# Patient Record
Sex: Male | Born: 1960 | Race: White | Hispanic: No | Marital: Married | Smoking: Never smoker
Health system: Southern US, Community
[De-identification: ages and names within clinical notes are randomized; demographics above are authoritative.]

## PROBLEM LIST (undated history)

## (undated) DIAGNOSIS — Z86018 Personal history of other benign neoplasm: Secondary | ICD-10-CM

## (undated) DIAGNOSIS — L570 Actinic keratosis: Secondary | ICD-10-CM

## (undated) DIAGNOSIS — Z86007 Personal history of in-situ neoplasm of skin: Secondary | ICD-10-CM

## (undated) HISTORY — PX: FINGER TENDON REPAIR: SHX1640

## (undated) HISTORY — PX: CYST EXCISION: SHX5701

## (undated) HISTORY — DX: Personal history of other benign neoplasm: Z86.018

## (undated) HISTORY — PX: OTHER SURGICAL HISTORY: SHX169

## (undated) HISTORY — DX: Personal history of in-situ neoplasm of skin: Z86.007

## (undated) HISTORY — PX: TONSILLECTOMY: SUR1361

## (undated) HISTORY — DX: Actinic keratosis: L57.0

---

## 2003-11-17 ENCOUNTER — Ambulatory Visit: Payer: Self-pay | Admitting: Family Medicine

## 2005-02-25 ENCOUNTER — Ambulatory Visit: Payer: Self-pay | Admitting: Family Medicine

## 2005-05-31 ENCOUNTER — Ambulatory Visit: Payer: Self-pay | Admitting: Family Medicine

## 2006-02-08 ENCOUNTER — Ambulatory Visit: Payer: Self-pay | Admitting: Family Medicine

## 2006-02-15 ENCOUNTER — Ambulatory Visit: Payer: Self-pay | Admitting: Family Medicine

## 2006-02-24 ENCOUNTER — Encounter (INDEPENDENT_AMBULATORY_CARE_PROVIDER_SITE_OTHER): Payer: Self-pay | Admitting: Specialist

## 2006-02-24 ENCOUNTER — Ambulatory Visit: Payer: Self-pay | Admitting: Family Medicine

## 2006-02-24 HISTORY — PX: VASECTOMY: SHX75

## 2006-03-27 ENCOUNTER — Ambulatory Visit: Payer: Self-pay | Admitting: Family Medicine

## 2006-03-30 ENCOUNTER — Ambulatory Visit: Payer: Self-pay | Admitting: Family Medicine

## 2006-05-26 ENCOUNTER — Ambulatory Visit: Payer: Self-pay | Admitting: Family Medicine

## 2006-05-26 LAB — CONVERTED CEMR LAB
ALT: 19 units/L (ref 0–40)
AST: 25 units/L (ref 0–37)
Bilirubin, Direct: 0.1 mg/dL (ref 0.0–0.3)
CO2: 31 meq/L (ref 19–32)
Calcium: 9.4 mg/dL (ref 8.4–10.5)
Chloride: 107 meq/L (ref 96–112)
Cholesterol: 192 mg/dL (ref 0–200)
Glucose, Bld: 89 mg/dL (ref 70–99)
HDL: 48.7 mg/dL (ref >39.0)
Sodium: 143 meq/L (ref 135–145)
TSH: 2.45 microintl units/mL (ref 0.35–5.50)
Total Bilirubin: 1.3 mg/dL — ABNORMAL HIGH (ref 0.3–1.2)
Total Protein: 6.7 g/dL (ref 6.0–8.3)

## 2006-06-02 ENCOUNTER — Ambulatory Visit: Payer: Self-pay | Admitting: Family Medicine

## 2007-02-16 ENCOUNTER — Encounter: Admission: RE | Admit: 2007-02-16 | Discharge: 2007-02-16 | Payer: Self-pay | Admitting: Family Medicine

## 2007-02-16 ENCOUNTER — Ambulatory Visit: Payer: Self-pay | Admitting: Family Medicine

## 2007-02-16 DIAGNOSIS — M79609 Pain in unspecified limb: Secondary | ICD-10-CM

## 2007-02-19 ENCOUNTER — Encounter: Payer: Self-pay | Admitting: Family Medicine

## 2007-02-19 ENCOUNTER — Encounter (INDEPENDENT_AMBULATORY_CARE_PROVIDER_SITE_OTHER): Payer: Self-pay | Admitting: Internal Medicine

## 2007-02-21 ENCOUNTER — Ambulatory Visit (HOSPITAL_BASED_OUTPATIENT_CLINIC_OR_DEPARTMENT_OTHER): Admission: RE | Admit: 2007-02-21 | Discharge: 2007-02-21 | Payer: Self-pay | Admitting: *Deleted

## 2008-09-25 ENCOUNTER — Ambulatory Visit: Payer: Self-pay | Admitting: Family Medicine

## 2008-09-25 DIAGNOSIS — M25529 Pain in unspecified elbow: Secondary | ICD-10-CM

## 2008-09-26 ENCOUNTER — Encounter (INDEPENDENT_AMBULATORY_CARE_PROVIDER_SITE_OTHER): Payer: Self-pay | Admitting: Internal Medicine

## 2009-08-05 ENCOUNTER — Encounter (INDEPENDENT_AMBULATORY_CARE_PROVIDER_SITE_OTHER): Payer: Self-pay | Admitting: *Deleted

## 2010-01-28 ENCOUNTER — Ambulatory Visit
Admission: RE | Admit: 2010-01-28 | Discharge: 2010-01-28 | Payer: Self-pay | Source: Home / Self Care | Attending: Family Medicine | Admitting: Family Medicine

## 2010-01-28 DIAGNOSIS — L723 Sebaceous cyst: Secondary | ICD-10-CM | POA: Insufficient documentation

## 2010-02-02 NOTE — Letter (Signed)
Summary: Nadara Eaton letter  [place] at [facility]  [address] [place], [place] [address]   Phone: [phone]  Fax: [phone]       08/05/2009 MRN: [id number]  Benjamin Simon [address] [place], [place]  [address]  Dear Mr. Onken,  [facility] [place] announce the retirement of Arta Silence, M.D., from full-time practice at the [facility] office effective July 02, 2009 and his plans of returning part-time.  It is important to Dr. Hetty Ely and to our practice that you understand that [facility] has seven physicians in our office for your health care needs.  We will continue to offer the same exceptional care that you have today.    Dr. Hetty Ely has spoken to many of you about his plans for retirement and returning part-time in the fall.   We will continue to work with you through the transition to schedule appointments for you in the office and meet the high standards that [facility] is committed to.   Again, it is with great pleasure that we share the news that Dr. Hetty Ely will return to [facility] in October of 2011 with a reduced schedule.    If you have any questions, or would like to request an appointment with one of our physicians, please call us at [phone] and press the option for Scheduling an appointment.  We take pleasure in providing you with excellent patient care and look forward to seeing you at your next office visit.  Our [facility] Physicians are:  Tillman Abide, M.D. Laurita Quint, M.D. Roxy Manns, M.D. Kerby Nora, M.D. Hannah Beat, M.D. Ruthe Mannan, M.D. We proudly welcomed Raechel Ache, M.D. and Eustaquio Boyden, M.D. to the practice in July/August 2011.  Sincerely,  [facility]

## 2010-02-04 ENCOUNTER — Ambulatory Visit (INDEPENDENT_AMBULATORY_CARE_PROVIDER_SITE_OTHER): Payer: BC Managed Care – PPO | Admitting: Family Medicine

## 2010-02-04 ENCOUNTER — Encounter: Payer: Self-pay | Admitting: Family Medicine

## 2010-02-04 DIAGNOSIS — Z4802 Encounter for removal of sutures: Secondary | ICD-10-CM

## 2010-02-04 NOTE — Assessment & Plan Note (Signed)
Summary: CHECK CYST ON HEAD,?REMOVAL/CLE   Vital Signs:  Patient profile:   50 year old male Weight:      167 pounds BMI:     26.25 Temp:     97.9 degrees F oral Pulse rate:   51 / minute Pulse rhythm:   regular BP sitting:   121 / 72  (left arm) Cuff size:   large  Vitals Entered By: Mervin Hack CMA Duncan Dull) (January 28, 2010 8:57 AM) CC: check cyst on head   History of Present Illness: Pt is here for "lump " on the head, right posterior area of the scalp. He has discomfort occas and wonders if it is getting bigger. Would like it removed.  Problems Prior to Update: 1)  Elbow Pain, Left  (ICD-719.42) 2)  Thumb Pain, Right  (ICD-729.5) 3)  Hypercholesterolemia, Borderline Low Hdl  (ICD-272.4) 4)  Examination, Routine Medical  (ICD-V70.0)  Medications Prior to Update: 1)  Ibuprofen 200 Mg Tabs (Ibuprofen) .... Take 2-3 By Mouth Q 4-6 Hrs As Needed 2)  Nizoral 2 % Sham (Ketoconazole) .... As Needed  Allergies: No Known Drug Allergies  Physical Exam  General:  alert, well-developed, well-nourished, and well-hydrated.   Head:  normocephalic, atraumatic, and no abnormalities observed.   Eyes:  pupils equal, pupils round, and no injection.   Ears:  External ear exam shows no significant lesions or deformities.  Otoscopic examination reveals clear canals, tympanic membranes are intact bilaterally without bulging, retraction, inflammation or discharge. Hearing is grossly normal bilaterally. Nose:  External nasal examination shows no deformity or inflammation. Nasal mucosa are pink and moist without lesions or exudates. Mouth:  Oral mucosa and oropharynx without lesions or exudates.  Teeth in good repair. Lungs:  Normal respiratory effort, chest expands symmetrically. Lungs are clear to auscultation, no crackles or wheezes. Heart:  Normal rate and regular rhythm. S1 and S2 normal without gallop, murmur, click, rub or other extra sounds. Skin:  Right posterior crown of scalp, 3cm  spongy swelling w/o erythema or tenderness. Prepped and draped in usual sterile manner with betadine. 2% lido w/ epi used for local. Incision made over area and entire cystic lesion removed in total until the very end when the sac collapsed and sebum was elicited. The area was cleansed and dried with packing which was removed. Three simple interrupted 4-0 sutures were placed. Bactroban applied along the incision line. Tolerated well.   Impression & Recommendations:  Problem # 1:  SEBACEOUS CYST, SCALP (ICD-706.2) Assessment New Excised. Suture removal in one week. Orders: I&D Abscess, Complex (10061)   Orders Added: 1)  I&D Abscess, Complex [10061]    Prior Medications: Current Allergies (reviewed today): No known allergies

## 2010-02-10 NOTE — Assessment & Plan Note (Signed)
   Vital Signs:  Patient profile:   50 year old male Weight:      167.50 pounds Temp:     97.2 degrees F oral Pulse rate:   60 / minute Pulse rhythm:   regular BP sitting:   110 / 78  (left arm) Cuff size:   regular  Vitals Entered By: Sydell Axon LPN (February 04, 2010 10:16 AM) CC: one week follow-up to remove stitches   History of Present Illness: Pt here for suture removal from sebaceous cyst excision. He has done well with no problems.  Allergies: No Known Drug Allergies  Physical Exam  Skin:  Incision clean and dry. Sutures in place and incision line healing well. Sutures removed. No steri strips placed due to being well healed. Neosporin placed and told to keep clean and dry for two days.   Impression & Recommendations:  Problem # 1:  ENCOUNTER FOR REMOVAL OF SUTURES (ICD-V58.32) Assessment Comment Only  Removed.  Orders: No Charge Patient Arrived (NCPA0) (NCPA0)   Orders Added: 1)  No Charge Patient Arrived (NCPA0) [NCPA0]    Current Allergies (reviewed today): No known allergies

## 2010-05-18 NOTE — Op Note (Signed)
NAME:  Benjamin Simon, Benjamin Simon               ACCOUNT NO.:  [id number]   MEDICAL RECORD NO.:  [id number]          PATIENT TYPE:  AMB   LOCATION:  DSC                          FACILITY:  [facility]   PHYSICIAN:  Matthew A. Weingold, M.D.DATE OF BIRTH:  12/15/1960   DATE OF PROCEDURE:  02/21/2007  DATE OF DISCHARGE:                               OPERATIVE REPORT   PREOPERATIVE DIAGNOSIS:  Acute right thumb ulnar collateral ligament  tear.   POSTOPERATIVE DIAGNOSIS:  Acute right thumb ulnar collateral ligament  tear.   PROCEDURE:  Operative repair of right thumb ulnar collateral ligament.   SURGEON:  Artist Pais. Mina Marble, M.D.   ASSISTANT:  None.   ANESTHESIA:  General.   TOURNIQUET TIME:  41 minutes.   COMPLICATIONS:  None.   DRAINS:  None.   OPERATIVE REPORT:  The patient was taken to the operating suite. After  induction of adequate general anesthesia, the right upper extremity was  prepped and draped in a sterile fashion.  An Esmarch was used to  exsanguinate the limb and the tourniquet was inflated to 250 mL. At this  point in time, an incision was made on the ulnar side of the metacarpal  phalangeal joint of the right thumb and a large dorsal based flap was  elevated.  Once this was done, the abductor aponeurosis was  longitudinally split.  After this was done, a dorsal ulnar capsulotomy  was performed.  Dissection was carried down to the ulnar side of the  metacarpal phalangeal joint.  There was a near complete tear of the  ulnar collateral ligament. Small visible fibers were intact. A small bit  of bone off the base of the proximal phalanx was encountered.  This  small fragment was excised.  The ligament was then carefully dissected  free from the underlying capsule.  It was sutured with a 2-0 double  armed Prolene and then drawn into the osseous defect using two Mellody Dance  needles that were tied over a well padded button on the radial side of  the proximal phalanx.  This completed the  repair. After this was done,  the wound was irrigated.  It was loosely closed in layers with 4-0  Mersilene on the capsule as well as a 4-0 Mersilene locked stitch on the  abductor aponeurosis followed by 4-0 Vicryl Rapide subcuticular on the  skin.  Steri-Strips, 4x4s, fluffs, and radial gutter splint was applied.  The patient tolerated the procedure well and went to the recovery room  in a stable condition.     Artist Pais Mina Marble, M.D.  Electronically Signed    MAW/MEDQ  D:  02/21/2007  T:  02/21/2007  Job:  [id number]

## 2010-05-21 NOTE — Assessment & Plan Note (Signed)
[name]                                 ON-CALL NOTE   Benjamin Simon, Benjamin Simon                        MRN:          [id number]  DATE:03/24/2006                            DOB:          26-Mar-1960    TIME RECEIVED:  2:11 p.m.   CALLER:  Roswell Nickel.   He sees Dr. Hetty Ely.   PHONE NUMBER:  [phone]   The patient says Dr. Hetty Ely performed a vasectomy on him 1 month ago.  He has had intermittent swelling and tenderness on 1 side of his scrotum  ever since.  Last night, he developed substantial swelling, redness, and  quite a bit of pain in the scrotum.  He is concerned because he is  leaving town later [employer] on a trip.  My answer is to go to Urgent Care  today.     Tera Mater. Clent Ridges, MD  Electronically Signed    SAF/MedQ  DD: 03/25/2006  DT: 03/25/2006  Job #: [id number]

## 2011-08-23 ENCOUNTER — Other Ambulatory Visit: Payer: Self-pay | Admitting: Family Medicine

## 2011-08-23 DIAGNOSIS — Z131 Encounter for screening for diabetes mellitus: Secondary | ICD-10-CM

## 2011-08-23 DIAGNOSIS — Z13220 Encounter for screening for lipoid disorders: Secondary | ICD-10-CM

## 2011-08-26 ENCOUNTER — Other Ambulatory Visit (INDEPENDENT_AMBULATORY_CARE_PROVIDER_SITE_OTHER): Payer: BC Managed Care – PPO

## 2011-08-26 DIAGNOSIS — Z131 Encounter for screening for diabetes mellitus: Secondary | ICD-10-CM

## 2011-08-26 DIAGNOSIS — Z13220 Encounter for screening for lipoid disorders: Secondary | ICD-10-CM

## 2011-08-26 LAB — LIPID PANEL
LDL Cholesterol: 111 mg/dL — ABNORMAL HIGH (ref 0–99)
Total CHOL/HDL Ratio: 3

## 2011-08-26 LAB — GLUCOSE, RANDOM: Glucose, Bld: 67 mg/dL — ABNORMAL LOW (ref 70–99)

## 2011-08-29 ENCOUNTER — Encounter: Payer: Self-pay | Admitting: Family Medicine

## 2011-09-02 ENCOUNTER — Ambulatory Visit (INDEPENDENT_AMBULATORY_CARE_PROVIDER_SITE_OTHER): Payer: BC Managed Care – PPO | Admitting: Family Medicine

## 2011-09-02 ENCOUNTER — Encounter: Payer: Self-pay | Admitting: Family Medicine

## 2011-09-02 VITALS — BP 114/84 | HR 61 | Temp 98.1°F | Ht 67.5 in | Wt 159.0 lb

## 2011-09-02 DIAGNOSIS — Z0000 Encounter for general adult medical examination without abnormal findings: Secondary | ICD-10-CM

## 2011-09-02 DIAGNOSIS — Z23 Encounter for immunization: Secondary | ICD-10-CM

## 2011-09-02 NOTE — Assessment & Plan Note (Signed)
Routine anticipatory guidance given to patient.  See health maintenance. Tetanus 2013 Flu encouraged Colon and prostate cancer screening not indicated Sugar and lipids wnl.  Advance directive encouarged.  Wife would be designated if he were incapacitated.

## 2011-09-02 NOTE — Patient Instructions (Addendum)
Take care. Glad to see you.   I would get a flu shot each fall.   I would get another physical in 1-2 years.

## 2011-09-02 NOTE — Progress Notes (Signed)
CPE- See plan.  Routine anticipatory guidance given to patient.  See health maintenance. Tetanus 2013 Flu encouraged Colon and prostate cancer screening not indicated Sugar and lipids wnl.  Advance directive encouarged.  Wife would be designated if he were incapacitated.    PMH and SH reviewed  Meds, vitals, and allergies reviewed.   ROS: See HPI.  Otherwise negative.    GEN: nad, alert and oriented HEENT: mucous membranes moist NECK: supple w/o LA CV: rrr. PULM: ctab, no inc wob ABD: soft, +bs EXT: no edema SKIN: no acute rash

## 2012-03-28 ENCOUNTER — Ambulatory Visit (INDEPENDENT_AMBULATORY_CARE_PROVIDER_SITE_OTHER): Payer: BC Managed Care – PPO | Admitting: Sports Medicine

## 2012-03-28 VITALS — BP 120/80 | Ht 67.0 in | Wt 160.0 lb

## 2012-03-28 DIAGNOSIS — M722 Plantar fascial fibromatosis: Secondary | ICD-10-CM

## 2012-03-28 NOTE — Assessment & Plan Note (Signed)
Patient was fitted with sports insoles and had corrections of small scaphoid pads as well as a small metatarsal pad bilaterally. Patient did have these put in his regular shoe which were comfortable. Patient wa basis. Encourage patient to concentrate on each centric exercises of the posterior capsule. Patient was also given arch straps that he can wear in his cycling shoes. Patient will return again in 6 weeks for further evaluation. At that time we will potentially readdress with an ultrasound.

## 2012-03-28 NOTE — Progress Notes (Signed)
Chief complaint: Right foot pain  History of present illness: The patient is a very pleasant 52 year old male coming in with a one year history of right foot pain. Patient has been diagnosed with plantar fasciitis before. Patient states that the pain seems to be worse in the morning and after sitting for long amount of time. In addition a distal recently he has some custom orthotics but he has noticed that he's been having more and heel pain even with activity. Patient denies any swelling, numbness, or that it is stopping any of his regular activities. Patient is still able to work out and is sleeping comfortably. Patient has used anti-inflammatories seldomly which has helped. Patient has not been doing icing on a regular basis or stretching. Patient describes the pain is more of a sharp stabbing sensation with the first steps that seems to resolve within still has a chronic dull aching sensation most of the time.  No past medical history on file.  Patient Active Problem List  Diagnosis  . ELBOW PAIN, LEFT  . THUMB PAIN, RIGHT  . SEBACEOUS CYST, SCALP  . Routine general medical examination at a health care facility    Past Surgical History  Procedure Laterality Date  . Tonsillectomy    . Fracture of right fibula with fractured ankle    . Laceration rle    . Vasectomy  02/24/2006    Schaller  . Finger tendon repair      R thumb tendon repair    Family History  Problem Relation Age of Onset  . Heart disease Father     CAD  . Colon cancer Neg Hx   . Prostate cancer Neg Hx     History  Substance Use Topics  . Smoking status: Never Smoker   . Smokeless tobacco: Not on file  . Alcohol Use: No   Physical exam Blood pressure 120/80, height 5\' 7"  (1.702 m), weight 160 lb (72.576 kg). General: No apparent distress alert and oriented x3 mood and affect normal Respiratory: Patient's speak in full sentences and does not appear short of breath Skin: Warm dry intact with no signs of  infection or rash Neuro: Cranial nerves II through XII are intact, neurovascularly intact in all extremities with 2+ DTRs and 2+ pulses. Right foot exam: Normal inspection with no visable or palpable fat pad atrophy and no visible swelling/erythema. Patient is tender at medial insertion of plantar fascia into calcaneus. 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 Patient's plantar fasciitis at the insertion at the calcaneus and does have thickening of 0.79 cm on the right side which is his symptomatic side. His left side measures 0.53 cm. 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 This measures approximately 0.4 cm in diameter.

## 2012-05-08 ENCOUNTER — Ambulatory Visit (INDEPENDENT_AMBULATORY_CARE_PROVIDER_SITE_OTHER): Payer: BC Managed Care – PPO | Admitting: Family Medicine

## 2012-05-08 ENCOUNTER — Encounter: Payer: Self-pay | Admitting: Family Medicine

## 2012-05-08 VITALS — BP 125/85 | HR 56 | Ht 67.0 in | Wt 160.0 lb

## 2012-05-08 DIAGNOSIS — M722 Plantar fascial fibromatosis: Secondary | ICD-10-CM

## 2012-05-08 MED ORDER — MELOXICAM 15 MG PO TABS: 15.0000 mg | ORAL_TABLET | Freq: Every day | ORAL | Status: DC

## 2012-05-08 MED ORDER — NITROGLYCERIN 0.2 MG/HR TD PT24: MEDICATED_PATCH | TRANSDERMAL | Status: DC

## 2012-05-08 NOTE — Patient Instructions (Signed)
Good to see you Call your insurance company when you have a chance and ask about coverage of custom orthotics.  If you want come back at anytime and we will make those if it is possible.  In the meantime I will refill the meloxicam.  We will try the nitro patch.  May give you headache and remember to look for rash.  Use 1/4 patch to area daily.  Continue the insoles we gave you.  We will send you to PT and they will call you to make an appointment.  Lets have you come back in 4-6 weeks  Or sooner if you want to do custom orthotics.

## 2012-05-08 NOTE — Progress Notes (Signed)
Chief complaint: Right sided foot pain  History of present illness: The patient is a 52 year old male coming back with right-sided plantar fasciitis. Patient did have a muscular skeletal ultrasound performed previously and did show a 0.79 cm thickening compared to the contralateral side. Patient states that the pain is minimally better than previously. Patient has started taking ibuprofen on its own accord and noticed some improvement. Patient is doing the exercises fairly regularly and tries to ice his foot tightly if he does not forget. Patient unfortunately states that he still has significant painfulness when he starts walking it seems to get worse after sitting at lunchtime. Patient denies any new symptoms.  Past medical history, social, surgical and family history all reviewed.   Physical exam Blood pressure 125/85, pulse 56, height 5\' 7"  (1.702 m), weight 160 lb (72.576 kg). General: No apparent distress alert and oriented x3 mood and affect normal Respiratory: Patient's speak in full sentences and does not appear short of breath Skin: Warm dry intact with no signs of infection or rash Neuro: Cranial nerves II through XII are intact, neurovascularly intact in all extremities with 2+ DTRs and 2+ pulses. Excision no deformity. Patient is still very tender to palpation of the medial insertion of the plantar fascia into the calcaneus. Great toe motion is normal. 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  Musculoskeletal ultrasound was performed and interpreted by me today. Patient's right plantar fascia does not show any significant hypoechoic changes and has a measurement of 0.59 cm which is significantly better than previous exam. There is some neovascularization on the superficial aspect. There is no bone spur appreciated. Patient small tear appears to have more healing and scar tissue formation in the area.

## 2012-05-08 NOTE — Assessment & Plan Note (Signed)
Ultrasound at showed patient's plantar fasciitis his decrease in size tremendously. Patient is back within the normal range. Patient's will try and nitroglycerin patch no secondary to the neovascularization in the area. We'll see if this will help some of his pain resolved. Patient will continue with the sports insoles but will check with his insurance company. If he is able to get custom orthotics he will call and have this scheduled. Patient will also start formal physical therapy for the plantar fasciitis with diaphoresis to see if this will help. Patient was given a prescription for meloxicam. Patient will try these interventions and then come back again in 4-6 weeks to make sure he continues to improve.

## 2012-05-23 ENCOUNTER — Ambulatory Visit: Payer: BC Managed Care – PPO | Attending: Sports Medicine | Admitting: Physical Therapy

## 2012-05-23 DIAGNOSIS — M25579 Pain in unspecified ankle and joints of unspecified foot: Secondary | ICD-10-CM | POA: Insufficient documentation

## 2012-05-23 DIAGNOSIS — R269 Unspecified abnormalities of gait and mobility: Secondary | ICD-10-CM | POA: Insufficient documentation

## 2012-05-23 DIAGNOSIS — M25559 Pain in unspecified hip: Secondary | ICD-10-CM | POA: Insufficient documentation

## 2012-05-23 DIAGNOSIS — IMO0001 Reserved for inherently not codable concepts without codable children: Secondary | ICD-10-CM | POA: Insufficient documentation

## 2012-06-04 ENCOUNTER — Ambulatory Visit: Payer: BC Managed Care – PPO | Attending: Sports Medicine | Admitting: Physical Therapy

## 2012-06-04 DIAGNOSIS — R269 Unspecified abnormalities of gait and mobility: Secondary | ICD-10-CM | POA: Insufficient documentation

## 2012-06-04 DIAGNOSIS — M25579 Pain in unspecified ankle and joints of unspecified foot: Secondary | ICD-10-CM | POA: Insufficient documentation

## 2012-06-04 DIAGNOSIS — IMO0001 Reserved for inherently not codable concepts without codable children: Secondary | ICD-10-CM | POA: Insufficient documentation

## 2012-06-04 DIAGNOSIS — M25559 Pain in unspecified hip: Secondary | ICD-10-CM | POA: Insufficient documentation

## 2012-06-11 ENCOUNTER — Ambulatory Visit: Payer: BC Managed Care – PPO | Admitting: Physical Therapy

## 2012-06-12 ENCOUNTER — Ambulatory Visit: Payer: BC Managed Care – PPO | Admitting: Family Medicine

## 2012-06-19 ENCOUNTER — Ambulatory Visit: Payer: BC Managed Care – PPO | Admitting: Physical Therapy

## 2012-06-19 ENCOUNTER — Ambulatory Visit (INDEPENDENT_AMBULATORY_CARE_PROVIDER_SITE_OTHER): Payer: BC Managed Care – PPO | Admitting: Family Medicine

## 2012-06-19 VITALS — BP 135/86 | Ht 67.0 in | Wt 160.0 lb

## 2012-06-19 DIAGNOSIS — M722 Plantar fascial fibromatosis: Secondary | ICD-10-CM

## 2012-06-19 NOTE — Progress Notes (Signed)
Patient is here for followup of bilateral plantar fasciitis. Patient has been going to physical therapy, and nitroglycerin patches, and sports insoles. Patient notices approximately 30-40% improvement. Patient states that throughout the day he seems to be doing much better but still has significant tightness in the morning when he wakes up right greater than left. Patient denies any new symptoms such as numbness or weakness. Patient also denies any fevers chills or any abnormal weight loss. Patient is here also for custom orthotics.  Physical Blood pressure 135/86, height 5\' 7"  (1.702 m), weight 160 lb (72.576 kg). Foot exam Normal inspection with no visable or palpable fat pad atrophy and no visible swelling/erythema. Patient is tender at medial insertion of plantar fascia into calcaneus. Great toe motion:normal Arch shape: Preterm transverse arch with splaying between the first and second toe Other foot breakdown: Patient does have mild overpronation of the hindfoot with ambulation.    Patient was fitted for a : standard, cushioned, semi-rigid orthotic. The orthotic was heated and afterward the patient stood on the orthotic blank positioned on the orthotic stand. The patient was positioned in subtalar neutral position and 10 degrees of ankle dorsiflexion in a weight bearing stance. After completion of molding, a stable base was applied to the orthotic blank. The blank was ground to a stable position for weight bearing. Size:9 Base: [facility] and Padding: Metatarsal pads bilaterally The patient ambulated these, and they were very comfortable.  I spent 45 minutes with this patient, greater than 50% was face-to-face time counseling regarding the below diagnosis.

## 2012-06-19 NOTE — Assessment & Plan Note (Signed)
Patient was fitted with custom orthotics today. Patient had face-to-face time for greater than 40 minutes while we did make the orthotics. Patient did have improved ambulation with decreased overpronation the hindfoot. Patient states that the orthotics were very comfortable. Patienthe can return at any time to have changes made. Patient otherwise will followup on an as-needed basis.

## 2012-06-25 ENCOUNTER — Ambulatory Visit: Payer: BC Managed Care – PPO | Admitting: Physical Therapy

## 2014-07-20 ENCOUNTER — Other Ambulatory Visit: Payer: Self-pay | Admitting: Family Medicine

## 2014-07-20 DIAGNOSIS — Z8249 Family history of ischemic heart disease and other diseases of the circulatory system: Secondary | ICD-10-CM

## 2014-07-28 ENCOUNTER — Other Ambulatory Visit (INDEPENDENT_AMBULATORY_CARE_PROVIDER_SITE_OTHER): Payer: PRIVATE HEALTH INSURANCE

## 2014-07-28 DIAGNOSIS — Z8249 Family history of ischemic heart disease and other diseases of the circulatory system: Secondary | ICD-10-CM

## 2014-07-28 LAB — LIPID PANEL
CHOL/HDL RATIO: 3
Cholesterol: 176 mg/dL (ref 0–200)
HDL: 53.5 mg/dL (ref >39.00)
LDL CALC: 106 mg/dL — AB (ref 0–99)
NONHDL: 122.5
Triglycerides: 81 mg/dL (ref 0.0–149.0)
VLDL: 16.2 mg/dL (ref 0.0–40.0)

## 2014-07-28 LAB — BASIC METABOLIC PANEL
BUN: 18 mg/dL (ref 6–23)
CALCIUM: 9.5 mg/dL (ref 8.4–10.5)
CO2: 29 meq/L (ref 19–32)
Chloride: 105 mEq/L (ref 96–112)
Creatinine, Ser: 1.05 mg/dL (ref 0.40–1.50)
GFR: 78.71 mL/min (ref >60.00)
GLUCOSE: 93 mg/dL (ref 70–99)
POTASSIUM: 4.7 meq/L (ref 3.5–5.1)
Sodium: 141 mEq/L (ref 135–145)

## 2014-07-29 ENCOUNTER — Other Ambulatory Visit: Payer: Self-pay

## 2014-08-05 ENCOUNTER — Ambulatory Visit (INDEPENDENT_AMBULATORY_CARE_PROVIDER_SITE_OTHER): Payer: PRIVATE HEALTH INSURANCE | Admitting: Family Medicine

## 2014-08-05 ENCOUNTER — Encounter: Payer: Self-pay | Admitting: Gastroenterology

## 2014-08-05 ENCOUNTER — Encounter: Payer: Self-pay | Admitting: Family Medicine

## 2014-08-05 VITALS — BP 104/64 | HR 57 | Temp 98.4°F | Ht 67.0 in | Wt 167.5 lb

## 2014-08-05 DIAGNOSIS — Z7189 Other specified counseling: Secondary | ICD-10-CM

## 2014-08-05 DIAGNOSIS — Z0000 Encounter for general adult medical examination without abnormal findings: Secondary | ICD-10-CM | POA: Diagnosis not present

## 2014-08-05 DIAGNOSIS — Z1211 Encounter for screening for malignant neoplasm of colon: Secondary | ICD-10-CM

## 2014-08-05 NOTE — Patient Instructions (Signed)
[facility] will call about your referral. Take care.  Glad to see you.  

## 2014-08-05 NOTE — Progress Notes (Signed)
Pre visit review using our clinic review tool, if applicable. No additional management support is needed unless otherwise documented below in the visit note.  CPE- See plan.  Routine anticipatory guidance given to patient.  See health maintenance. Tetanus 2013 PNA and shingles shot not due.   Flu encouraged Prostate cancer screening and PSA options (with potential risks and benefits of testing vs not testing) were discussed along with recent recs/guidelines.  He declined testing PSA at this point. D/w patient AS:[id number] for colon cancer screening, including IFOB vs. colonoscopy.  Risks and benefits of both were discussed and patient voiced understanding.  Pt elects for: colonoscopy.  Sugar and lipids wnl. D/w pt.  Advance directive encouarged. Wife would be designated if he were incapacitated.   PMH and SH reviewed  Meds, vitals, and allergies reviewed.   ROS: See HPI.  Otherwise negative.    GEN: nad, alert and oriented HEENT: mucous membranes moist NECK: supple w/o LA CV: rrr. PULM: ctab, no inc wob ABD: soft, +bs EXT: no edema SKIN: no acute rash

## 2014-08-06 DIAGNOSIS — Z7189 Other specified counseling: Secondary | ICD-10-CM | POA: Insufficient documentation

## 2014-08-06 NOTE — Assessment & Plan Note (Signed)
Routine anticipatory guidance given to patient. See health maintenance.  Tetanus 2013  PNA and shingles shot not due.  Flu encouraged  Prostate cancer screening and PSA options (with potential risks and benefits of testing vs not testing) were discussed along with recent recs/guidelines. He declined testing PSA at this point.  D/w patient WC:[id number] for colon cancer screening, including IFOB vs. colonoscopy. Risks and benefits of both were discussed and patient voiced understanding. Pt elects for: colonoscopy.  Sugar and lipids wnl. D/w pt.  Advance directive encouarged. Wife would be designated if he were incapacitated.

## 2014-09-24 ENCOUNTER — Ambulatory Visit (AMBULATORY_SURGERY_CENTER): Payer: Self-pay | Admitting: *Deleted

## 2014-09-24 VITALS — Ht 67.0 in | Wt 166.0 lb

## 2014-09-24 DIAGNOSIS — Z1211 Encounter for screening for malignant neoplasm of colon: Secondary | ICD-10-CM

## 2014-09-24 MED ORDER — NA SULFATE-K SULFATE-MG SULF 17.5-3.13-1.6 GM/177ML PO SOLN: ORAL | Status: DC

## 2014-09-24 NOTE — Progress Notes (Signed)
Patient denies any allergies to eggs or soy. Patient denies any problems with anesthesia/sedation. Patient denies any oxygen use at home and does not take any diet/weight loss medications. EMMI education assisgned to patient on colonoscopy, this was explained and instructions given to patient. 

## 2014-10-08 ENCOUNTER — Encounter: Payer: PRIVATE HEALTH INSURANCE | Admitting: Gastroenterology

## 2014-10-29 ENCOUNTER — Ambulatory Visit (AMBULATORY_SURGERY_CENTER): Payer: PRIVATE HEALTH INSURANCE | Admitting: Gastroenterology

## 2014-10-29 ENCOUNTER — Encounter: Payer: Self-pay | Admitting: Gastroenterology

## 2014-10-29 VITALS — BP 109/70 | HR 51 | Temp 96.1°F | Resp 20 | Ht 67.0 in | Wt 166.0 lb

## 2014-10-29 DIAGNOSIS — Z1211 Encounter for screening for malignant neoplasm of colon: Secondary | ICD-10-CM

## 2014-10-29 MED ORDER — SODIUM CHLORIDE 0.9 % IV SOLN: 500.0000 mL | INTRAVENOUS | Status: DC

## 2014-10-29 NOTE — Op Note (Signed)
[place]  [address] [place], [id number]   COLONOSCOPY PROCEDURE REPORT  PATIENT: Benjamin Simon, Benjamin Simon  MR#: [id number] BIRTHDATE: 1960-05-25 , 54  yrs. old GENDER: male ENDOSCOPIST: Yetta Flock, MD REFERRED BY: Elsie Stain MD PROCEDURE DATE:  10/29/2014 PROCEDURE:   Colonoscopy, screening First Screening Colonoscopy - Avg.  risk and is 50 yrs.  old or older Yes.  Prior Negative Screening - Now for repeat screening. N/A  History of Adenoma - Now for follow-up colonoscopy & has been > or = to 3 yrs.  N/A  Polyps removed today? No Recommend repeat exam, <10 yrs? No ASA CLASS:   Class II INDICATIONS:Screening for colonic neoplasia and Colorectal Neoplasm Risk Assessment for this procedure is average risk. MEDICATIONS: Propofol 250 mg IV  DESCRIPTION OF PROCEDURE:   After the risks benefits and alternatives of the procedure were thoroughly explained, informed consent was obtained.  The digital rectal exam revealed no abnormalities of the rectum.   The [id number]  endoscope was introduced through the anus and advanced to the cecum, which was identified by both the appendix and ileocecal valve. No adverse events experienced.   The quality of the prep was adequate  The instrument was then slowly withdrawn as the colon was fully examined. Estimated blood loss is zero unless otherwise noted in this procedure report.   COLON FINDINGS: A normal appearing cecum, ileocecal valve, and appendiceal orifice were identified.  The ascending, transverse, descending, sigmoid colon, and rectum appeared unremarkable. No polyps or mass lesions noted. Retroflexed views revealed small internal hemorrhoids. The time to cecum = 2.4 Withdrawal time = 15.2   The scope was withdrawn and the procedure completed. COMPLICATIONS: There were no immediate complications.  ENDOSCOPIC IMPRESSION: Normal colonoscopy  RECOMMENDATIONS: Resume diet Resume medications Repeat  colonoscopy to be done in 10 years for further colon cancer screening  eSigned:  Yetta Flock, MD 10/29/2014 8:48 AM   cc: Elsie Stain MD, the patient

## 2014-10-29 NOTE — Progress Notes (Signed)
Transferred to recovery room. A/O x3, pleased with MAC.  VSS.  Report to Tracy, RN. 

## 2014-10-29 NOTE — Patient Instructions (Signed)
Findings:  Normal Recommendations:  Resume medications and Diet, Repeat colonoscopy in 10 years.  YOU HAD AN ENDOSCOPIC PROCEDURE TODAY AT [facility]:   Refer to the procedure report that was given to you for any specific questions about what was found during the examination.  If the procedure report does not answer your questions, please call your gastroenterologist to clarify.  If you requested that your care partner not be given the details of your procedure findings, then the procedure report has been included in a sealed envelope for you to review at your convenience later.  YOU SHOULD EXPECT: Some feelings of bloating in the abdomen. Passage of more gas than usual.  Walking can help get rid of the air that was put into your GI tract during the procedure and reduce the bloating. If you had a lower endoscopy (such as a colonoscopy or flexible sigmoidoscopy) you may notice spotting of blood in your stool or on the toilet paper. If you underwent a bowel prep for your procedure, you may not have a normal bowel movement for a few days.  Please Note:  You might notice some irritation and congestion in your nose or some drainage.  This is from the oxygen used during your procedure.  There is no need for concern and it should clear up in a day or so.  SYMPTOMS TO REPORT IMMEDIATELY:   Following lower endoscopy (colonoscopy or flexible sigmoidoscopy):  Excessive amounts of blood in the stool  Significant tenderness or worsening of abdominal pains  Swelling of the abdomen that is new, acute  Fever of 100F or higher   Following upper endoscopy (EGD)  Vomiting of blood or coffee ground material  New chest pain or pain under the shoulder blades  Painful or persistently difficult swallowing  New shortness of breath  Fever of 100F or higher  Black, tarry-looking stools  For urgent or emergent issues, a gastroenterologist can be reached at any hour by calling [phone]  [phone].   DIET: Your first meal following the procedure should be a small meal and then it is ok to progress to your normal diet. Heavy or fried foods are harder to digest and may make you feel nauseous or bloated.  Likewise, meals heavy in dairy and vegetables can increase bloating.  Drink plenty of fluids but you should avoid alcoholic beverages for 24 hours.  ACTIVITY:  You should plan to take it easy for the rest of today and you should NOT DRIVE or use heavy machinery until tomorrow (because of the sedation medicines used during the test).    FOLLOW UP: Our staff will call the number listed on your records the next business day following your procedure to check on you and address any questions or concerns that you may have regarding the information given to you following your procedure. If we do not reach you, we will leave a message.  However, if you are feeling well and you are not experiencing any problems, there is no need to return our call.  We will assume that you have returned to your regular daily activities without incident.  If any biopsies were taken you will be contacted by phone or by letter within the next 1-3 weeks.  Please call us at [phone] if you have not heard about the biopsies in 3 weeks.    SIGNATURES/CONFIDENTIALITY: You and/or your care partner have signed paperwork which will be entered into your electronic medical record.  These signatures attest to  the fact that that the information above on your After Visit Summary has been reviewed and is understood.  Full responsibility of the confidentiality of this discharge information lies with you and/or your care-partner.  Please follow all discharge instructions given to you by the recovery room nurse. If you have any questions or problems after discharge please call one of the numbers listed above. You will receive a phone call in the am to see how you are doing and answer any questions you may have. Thank you for  choosing [facility] for your health care needs.

## 2014-10-30 ENCOUNTER — Telehealth: Payer: Self-pay | Admitting: *Deleted

## 2014-10-30 NOTE — Telephone Encounter (Signed)
  Follow up Call-  Call back number 10/29/2014  Post procedure Call Back phone  # [phone]  Permission to leave phone message Yes     Patient questions:  Wrong number x 2.

## 2015-06-08 ENCOUNTER — Ambulatory Visit: Payer: PRIVATE HEALTH INSURANCE | Admitting: Family Medicine

## 2015-08-20 DIAGNOSIS — C4492 Squamous cell carcinoma of skin, unspecified: Secondary | ICD-10-CM

## 2015-08-20 HISTORY — DX: Squamous cell carcinoma of skin, unspecified: C44.92

## 2016-10-05 DIAGNOSIS — Z86018 Personal history of other benign neoplasm: Secondary | ICD-10-CM

## 2016-10-05 HISTORY — DX: Personal history of other benign neoplasm: Z86.018

## 2016-10-27 ENCOUNTER — Ambulatory Visit (INDEPENDENT_AMBULATORY_CARE_PROVIDER_SITE_OTHER): Payer: 59 | Admitting: Family Medicine

## 2016-10-27 ENCOUNTER — Encounter: Payer: Self-pay | Admitting: Family Medicine

## 2016-10-27 DIAGNOSIS — M25519 Pain in unspecified shoulder: Secondary | ICD-10-CM | POA: Diagnosis not present

## 2016-10-27 NOTE — Patient Instructions (Signed)
Try the shoulder exercises, limit lifting as much as possible, ibuprofen with food.  If not better, as about seeing Dr. Lorelei Pont.  Take care.  Glad to see you.

## 2016-10-27 NOTE — Progress Notes (Signed)
R shoulder predates his L shoulder pain.  Had been doing a lot of manual work at home. Pain putting a coat on, raising his arm.  No specific injury with either shoulder recently.  He recalled a dislocation of an unrecalled shoulder about 15 years ago, it self reduced.  Pain laying on either side at night.  Ibuprofen helps a little.  Laying off lifting helped a little this week.  Grip is still normal.    Still biking 160 miles a week, weather permitting.    Encouraged cessation from chewing tobacco- he is cutting back.   Meds, vitals, and allergies reviewed.   ROS: Per HPI unless specifically indicated in ROS section   nad ncat Neck supple, normal ROM rrr ctab B shoulder exams are similar.  No arm drop, AC not ttp on testing. Mild pain on supraspinatus testing. More pain with internal rotation compared to external rotation. He does appear to have some mild postural winging of the scapula bilaterally he does have some relief of pain with internal rotation with scapular assist, right-sided more than left. Grip still intact in both hands. Distally neurovascularly intact.

## 2016-10-28 DIAGNOSIS — M25519 Pain in unspecified shoulder: Secondary | ICD-10-CM | POA: Insufficient documentation

## 2016-10-28 NOTE — Assessment & Plan Note (Signed)
He has typical rotator cuff symptoms with pain laying on either side at night and pain with overhead movement. I don't know how much of this is postural and related to positioning with cycling. No need to x-ray at this point. Reasonable to try home exercise program for shoulder/cuff rehabilitation exercises. Handout given and exercises discussed patient. If he does not make any improvement then it would be reasonable to see the sports medicine clinic. He agrees with plan. Update me as needed.

## 2019-05-16 ENCOUNTER — Ambulatory Visit: Payer: 59 | Admitting: Family Medicine

## 2019-08-29 ENCOUNTER — Ambulatory Visit (INDEPENDENT_AMBULATORY_CARE_PROVIDER_SITE_OTHER): Payer: 59 | Admitting: Dermatology

## 2019-08-29 ENCOUNTER — Other Ambulatory Visit: Payer: Self-pay

## 2019-08-29 ENCOUNTER — Encounter: Payer: Self-pay | Admitting: Dermatology

## 2019-08-29 DIAGNOSIS — D489 Neoplasm of uncertain behavior, unspecified: Secondary | ICD-10-CM

## 2019-08-29 DIAGNOSIS — D485 Neoplasm of uncertain behavior of skin: Secondary | ICD-10-CM | POA: Diagnosis not present

## 2019-08-29 DIAGNOSIS — Z86007 Personal history of in-situ neoplasm of skin: Secondary | ICD-10-CM | POA: Diagnosis not present

## 2019-08-29 DIAGNOSIS — D229 Melanocytic nevi, unspecified: Secondary | ICD-10-CM

## 2019-08-29 DIAGNOSIS — Z1283 Encounter for screening for malignant neoplasm of skin: Secondary | ICD-10-CM

## 2019-08-29 DIAGNOSIS — L814 Other melanin hyperpigmentation: Secondary | ICD-10-CM

## 2019-08-29 DIAGNOSIS — Z86018 Personal history of other benign neoplasm: Secondary | ICD-10-CM | POA: Diagnosis not present

## 2019-08-29 DIAGNOSIS — L578 Other skin changes due to chronic exposure to nonionizing radiation: Secondary | ICD-10-CM

## 2019-08-29 NOTE — Patient Instructions (Signed)

## 2019-08-29 NOTE — Progress Notes (Signed)
   Follow-Up Visit   Subjective  Benjamin Simon is a 59 y.o. male who presents for the following: Annual Exam (total skin exam, hx of SCC in situ, hx of dysplastic nevus) and other (pt has spots on face and back of neck). The patient presents for Total-Body Skin Exam (TBSE) for skin cancer screening and mole check.  The following portions of the chart were reviewed this encounter and updated as appropriate: Tobacco  Allergies  Meds  Problems  Med Hx  Surg Hx  Fam Hx     Review of Systems: No other skin or systemic complaints except as noted in HPI or Assessment and Plan.   Objective  Well appearing patient in no apparent distress; mood and affect are within normal limits.  A full examination was performed including scalp, head, eyes, ears, nose, lips, neck, chest, axillae, abdomen, back, buttocks, bilateral upper extremities, bilateral lower extremities, hands, feet, fingers, toes, fingernails, and toenails. All findings within normal limits unless otherwise noted below.  Objective  Right medial inf scapula: Irregular brown macule 0.4 cm  Assessment & Plan  Neoplasm of uncertain behavior Right medial inf scapula  Skin / nail biopsy Type of biopsy: tangential   Informed consent: discussed and consent obtained   Timeout: patient name, date of birth, surgical site, and procedure verified   Procedure prep:  Patient was prepped and draped in usual sterile fashion Prep type:  Isopropyl alcohol Anesthesia: the lesion was anesthetized in a standard fashion   Anesthetic:  1% lidocaine w/ epinephrine 1-100,000 buffered w/ 8.4% NaHCO3 Instrument used: flexible razor blade   Hemostasis achieved with: pressure, aluminum chloride and electrodesiccation   Outcome: patient tolerated procedure well   Post-procedure details: sterile dressing applied and wound care instructions given   Dressing type: bandage and petrolatum    Specimen 1 - Surgical pathology Differential Diagnosis: D48.5  Rule out dysplasia. 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  - Call if any new or changing lesions are noted between office visits  Lentigines - Scattered tan macules - Discussed due to sun exposure - Benign, observe - Call for any changes  Melanocytic Nevi - Tan-brown and/or pink-flesh-colored symmetric macules and papules - Benign appearing on exam today - Observation - Call clinic for new or changing moles - Recommend daily use of broad spectrum spf 30+ sunscreen to sun-exposed areas.   Actinic Damage - diffuse scaly erythematous macules with underlying dyspigmentation - Recommend daily broad spectrum sunscreen SPF 30+ to sun-exposed areas, reapply every 2 hours as needed.  - Call for new or changing lesions.  Return in 1 year (on 08/28/2020) for tbse.   IHarriett Sine, CMA, am acting as scribe for Sarina Ser, MD.  Documentation: I have reviewed the above documentation for accuracy and completeness, and I agree with the above.  Sarina Ser, MD

## 2019-09-02 ENCOUNTER — Telehealth: Payer: Self-pay

## 2019-09-02 NOTE — Telephone Encounter (Signed)
Patient informed. 

## 2019-09-02 NOTE — Telephone Encounter (Signed)
-----   Message from Ralene Bathe, MD sent at 09/02/2019 12:10 PM EDT ----- Skin , (A) right medial infeior scapula DYSPLASTIC COMPOUND NEVUS WITH MODERATE ATYPIA, INFLAMED  Dysplastic Moderate Recheck next visit

## 2019-09-06 ENCOUNTER — Encounter: Payer: Self-pay | Admitting: Dermatology

## 2020-01-04 LAB — HM HEPATITIS C SCREENING LAB: HM Hepatitis Screen: NEGATIVE

## 2020-01-04 LAB — HM HIV SCREENING LAB: HM HIV Screening: NEGATIVE

## 2020-01-09 DIAGNOSIS — M9901 Segmental and somatic dysfunction of cervical region: Secondary | ICD-10-CM | POA: Diagnosis not present

## 2020-01-09 DIAGNOSIS — M9903 Segmental and somatic dysfunction of lumbar region: Secondary | ICD-10-CM | POA: Diagnosis not present

## 2020-01-09 DIAGNOSIS — M5416 Radiculopathy, lumbar region: Secondary | ICD-10-CM | POA: Diagnosis not present

## 2020-01-09 DIAGNOSIS — M5033 Other cervical disc degeneration, cervicothoracic region: Secondary | ICD-10-CM | POA: Diagnosis not present

## 2020-02-06 DIAGNOSIS — M5033 Other cervical disc degeneration, cervicothoracic region: Secondary | ICD-10-CM | POA: Diagnosis not present

## 2020-02-06 DIAGNOSIS — M5416 Radiculopathy, lumbar region: Secondary | ICD-10-CM | POA: Diagnosis not present

## 2020-02-06 DIAGNOSIS — M9901 Segmental and somatic dysfunction of cervical region: Secondary | ICD-10-CM | POA: Diagnosis not present

## 2020-02-06 DIAGNOSIS — M9903 Segmental and somatic dysfunction of lumbar region: Secondary | ICD-10-CM | POA: Diagnosis not present

## 2020-03-05 DIAGNOSIS — M5033 Other cervical disc degeneration, cervicothoracic region: Secondary | ICD-10-CM | POA: Diagnosis not present

## 2020-03-05 DIAGNOSIS — M9903 Segmental and somatic dysfunction of lumbar region: Secondary | ICD-10-CM | POA: Diagnosis not present

## 2020-03-05 DIAGNOSIS — M9901 Segmental and somatic dysfunction of cervical region: Secondary | ICD-10-CM | POA: Diagnosis not present

## 2020-03-05 DIAGNOSIS — M5416 Radiculopathy, lumbar region: Secondary | ICD-10-CM | POA: Diagnosis not present

## 2020-03-10 DIAGNOSIS — M5416 Radiculopathy, lumbar region: Secondary | ICD-10-CM | POA: Diagnosis not present

## 2020-03-10 DIAGNOSIS — M9903 Segmental and somatic dysfunction of lumbar region: Secondary | ICD-10-CM | POA: Diagnosis not present

## 2020-03-10 DIAGNOSIS — M9901 Segmental and somatic dysfunction of cervical region: Secondary | ICD-10-CM | POA: Diagnosis not present

## 2020-03-10 DIAGNOSIS — M5033 Other cervical disc degeneration, cervicothoracic region: Secondary | ICD-10-CM | POA: Diagnosis not present

## 2020-04-09 DIAGNOSIS — M5416 Radiculopathy, lumbar region: Secondary | ICD-10-CM | POA: Diagnosis not present

## 2020-04-09 DIAGNOSIS — M5033 Other cervical disc degeneration, cervicothoracic region: Secondary | ICD-10-CM | POA: Diagnosis not present

## 2020-04-09 DIAGNOSIS — M9903 Segmental and somatic dysfunction of lumbar region: Secondary | ICD-10-CM | POA: Diagnosis not present

## 2020-04-09 DIAGNOSIS — M9901 Segmental and somatic dysfunction of cervical region: Secondary | ICD-10-CM | POA: Diagnosis not present

## 2020-05-07 DIAGNOSIS — M9901 Segmental and somatic dysfunction of cervical region: Secondary | ICD-10-CM | POA: Diagnosis not present

## 2020-05-07 DIAGNOSIS — M5033 Other cervical disc degeneration, cervicothoracic region: Secondary | ICD-10-CM | POA: Diagnosis not present

## 2020-05-07 DIAGNOSIS — M5416 Radiculopathy, lumbar region: Secondary | ICD-10-CM | POA: Diagnosis not present

## 2020-05-07 DIAGNOSIS — M9903 Segmental and somatic dysfunction of lumbar region: Secondary | ICD-10-CM | POA: Diagnosis not present

## 2020-05-08 DIAGNOSIS — M5033 Other cervical disc degeneration, cervicothoracic region: Secondary | ICD-10-CM | POA: Diagnosis not present

## 2020-05-08 DIAGNOSIS — M9901 Segmental and somatic dysfunction of cervical region: Secondary | ICD-10-CM | POA: Diagnosis not present

## 2020-05-08 DIAGNOSIS — M5416 Radiculopathy, lumbar region: Secondary | ICD-10-CM | POA: Diagnosis not present

## 2020-05-08 DIAGNOSIS — M9903 Segmental and somatic dysfunction of lumbar region: Secondary | ICD-10-CM | POA: Diagnosis not present

## 2020-05-21 DIAGNOSIS — M5033 Other cervical disc degeneration, cervicothoracic region: Secondary | ICD-10-CM | POA: Diagnosis not present

## 2020-05-21 DIAGNOSIS — M9901 Segmental and somatic dysfunction of cervical region: Secondary | ICD-10-CM | POA: Diagnosis not present

## 2020-05-21 DIAGNOSIS — M9903 Segmental and somatic dysfunction of lumbar region: Secondary | ICD-10-CM | POA: Diagnosis not present

## 2020-05-21 DIAGNOSIS — M5416 Radiculopathy, lumbar region: Secondary | ICD-10-CM | POA: Diagnosis not present

## 2020-06-05 DIAGNOSIS — M9903 Segmental and somatic dysfunction of lumbar region: Secondary | ICD-10-CM | POA: Diagnosis not present

## 2020-06-05 DIAGNOSIS — M5033 Other cervical disc degeneration, cervicothoracic region: Secondary | ICD-10-CM | POA: Diagnosis not present

## 2020-06-05 DIAGNOSIS — M5416 Radiculopathy, lumbar region: Secondary | ICD-10-CM | POA: Diagnosis not present

## 2020-06-05 DIAGNOSIS — M9901 Segmental and somatic dysfunction of cervical region: Secondary | ICD-10-CM | POA: Diagnosis not present

## 2020-07-07 DIAGNOSIS — M9903 Segmental and somatic dysfunction of lumbar region: Secondary | ICD-10-CM | POA: Diagnosis not present

## 2020-07-07 DIAGNOSIS — M5416 Radiculopathy, lumbar region: Secondary | ICD-10-CM | POA: Diagnosis not present

## 2020-07-07 DIAGNOSIS — M9901 Segmental and somatic dysfunction of cervical region: Secondary | ICD-10-CM | POA: Diagnosis not present

## 2020-07-07 DIAGNOSIS — M5033 Other cervical disc degeneration, cervicothoracic region: Secondary | ICD-10-CM | POA: Diagnosis not present

## 2020-08-06 DIAGNOSIS — M9901 Segmental and somatic dysfunction of cervical region: Secondary | ICD-10-CM | POA: Diagnosis not present

## 2020-08-06 DIAGNOSIS — M5033 Other cervical disc degeneration, cervicothoracic region: Secondary | ICD-10-CM | POA: Diagnosis not present

## 2020-08-06 DIAGNOSIS — M5416 Radiculopathy, lumbar region: Secondary | ICD-10-CM | POA: Diagnosis not present

## 2020-08-06 DIAGNOSIS — M9903 Segmental and somatic dysfunction of lumbar region: Secondary | ICD-10-CM | POA: Diagnosis not present

## 2020-09-03 ENCOUNTER — Ambulatory Visit: Payer: BC Managed Care – PPO | Admitting: Dermatology

## 2020-09-03 ENCOUNTER — Other Ambulatory Visit: Payer: Self-pay

## 2020-09-03 DIAGNOSIS — L309 Dermatitis, unspecified: Secondary | ICD-10-CM

## 2020-09-03 DIAGNOSIS — L821 Other seborrheic keratosis: Secondary | ICD-10-CM

## 2020-09-03 DIAGNOSIS — Z86007 Personal history of in-situ neoplasm of skin: Secondary | ICD-10-CM

## 2020-09-03 DIAGNOSIS — L814 Other melanin hyperpigmentation: Secondary | ICD-10-CM

## 2020-09-03 DIAGNOSIS — L578 Other skin changes due to chronic exposure to nonionizing radiation: Secondary | ICD-10-CM | POA: Diagnosis not present

## 2020-09-03 DIAGNOSIS — Z1283 Encounter for screening for malignant neoplasm of skin: Secondary | ICD-10-CM

## 2020-09-03 DIAGNOSIS — L719 Rosacea, unspecified: Secondary | ICD-10-CM

## 2020-09-03 DIAGNOSIS — D1800 Hemangioma unspecified site: Secondary | ICD-10-CM

## 2020-09-03 DIAGNOSIS — D229 Melanocytic nevi, unspecified: Secondary | ICD-10-CM

## 2020-09-03 DIAGNOSIS — Z86018 Personal history of other benign neoplasm: Secondary | ICD-10-CM | POA: Diagnosis not present

## 2020-09-03 DIAGNOSIS — L219 Seborrheic dermatitis, unspecified: Secondary | ICD-10-CM

## 2020-09-03 MED ORDER — XOLEGEL 2 % EX GEL: 1.0000 "application " | Freq: Every day | CUTANEOUS | 3 refills | Status: DC

## 2020-09-03 MED ORDER — HYDROCORTISONE 2.5 % EX LOTN: TOPICAL_LOTION | CUTANEOUS | 3 refills | Status: DC

## 2020-09-03 NOTE — Progress Notes (Signed)
Follow-Up Visit   Subjective  Benjamin Simon is a 60 y.o. male who presents for the following: Annual Exam (Mole check ). Hx of SCC, Hx of BCC.  Complains of rash of face. The patient presents for Total-Body Skin Exam (TBSE) for skin cancer screening and mole check.   The following portions of the chart were reviewed this encounter and updated as appropriate:      Review of Systems:  No other skin or systemic complaints except as noted in HPI or Assessment and Plan.  Objective  Well appearing patient in no apparent distress; mood and affect are within normal limits.  A full examination was performed including scalp, head, eyes, ears, nose, lips, neck, chest, axillae, abdomen, back, buttocks, bilateral upper extremities, bilateral lower extremities, hands, feet, fingers, toes, fingernails, and toenails. All findings within normal limits unless otherwise noted below.  Head - Anterior (Face) Mid face erythema with telangiectasias  face x 2 Erythematous keratotic or waxy stuck-on papule or plaque.    Assessment & Plan  Rosacea Head - Anterior (Face)  Rosacea is a chronic progressive skin condition usually affecting the face of adults, causing redness and/or acne bumps. It is treatable but not curable. It sometimes affects the eyes (ocular rosacea) as well. It may respond to topical and/or systemic medication and can flare with stress, sun exposure, alcohol, exercise and some foods.  Daily application of broad spectrum spf 30+ sunscreen to face is recommended to reduce flares.   Discussed laser BBL face.  Seborrheic keratosis face x 2  Pt will pay out of pocket for treatment today   Reassured benign age-related growth.  Recommend observation.  Discussed cryotherapy if spot(s) become irritated or inflamed.   Destruction of lesion - face x 2 Complexity: simple   Destruction method: cryotherapy   Informed consent: discussed and consent obtained   Timeout:  patient name, date of  birth, surgical site, and procedure verified Lesion destroyed using liquid nitrogen: Yes   Region frozen until ice ball extended beyond lesion: Yes   Outcome: patient tolerated procedure well with no complications   Post-procedure details: wound care instructions given    Seborrheic Dermatitis Face Seborrheic Dermatitis  -  is a chronic persistent rash characterized by pinkness and scaling most commonly of the mid face but also can occur on the scalp (dandruff), ears; mid chest, mid back and groin.  It tends to be exacerbated by stress and cooler weather.  People who have neurologic disease may experience new onset or exacerbation of existing seborrheic dermatitis.  The condition is not curable but treatable and can be controlled.  Xolgel apply to face at bedtime M/W/F  Hydrocortisone 2.5% lotion apply to face at bedtime Tues/Thurs/Sat  Related Medications Ketoconazole (XOLEGEL) 2 % GEL Apply 1 application topically at bedtime. Apply to face at bedtime Monday/Wednesday/Friday  hydrocortisone 2.5 % lotion Apply to face at bedtime Tuesday/Thursday/Saturday  History of Squamous Cell Carcinoma of the Skin in situ Left anterior deltoid 201 - No evidence of recurrence today - No lymphadenopathy - Recommend regular full body skin exams - Recommend daily broad spectrum sunscreen SPF 30+ to sun-exposed areas, reapply every 2 hours as needed.  - Call if any new or changing lesions are noted between office visits   History of Dysplastic Nevi Right medial to inf scapula upper back 2018 - No evidence of recurrence today - Recommend regular full body skin exams - Recommend daily broad spectrum sunscreen SPF 30+ to sun-exposed areas, reapply every  2 hours as needed.  - Call if any new or changing lesions are noted between office visits   Lentigines - Scattered tan macules - Due to sun exposure - Benign-appering, observe - Recommend daily broad spectrum sunscreen SPF 30+ to sun-exposed  areas, reapply every 2 hours as needed. - Call for any changes  Seborrheic Keratoses - Stuck-on, waxy, tan-brown papules and/or plaques  - Benign-appearing - Discussed benign etiology and prognosis. - Observe - Call for any changes  Melanocytic Nevi - Tan-brown and/or pink-flesh-colored symmetric macules and papules - Benign appearing on exam today - Observation - Call clinic for new or changing moles - Recommend daily use of broad spectrum spf 30+ sunscreen to sun-exposed areas.   Hemangiomas - Red papules - Discussed benign nature - Observe - Call for any changes  Actinic Damage - Chronic condition, secondary to cumulative UV/sun exposure - diffuse scaly erythematous macules with underlying dyspigmentation - Recommend daily broad spectrum sunscreen SPF 30+ to sun-exposed areas, reapply every 2 hours as needed.  - Staying in the shade or wearing long sleeves, sun glasses (UVA+UVB protection) and wide brim hats (4-inch brim around the entire circumference of the hat) are also recommended for sun protection.  - Call for new or changing lesions.  Skin cancer screening performed today.   Return in about 1 year (around 09/03/2021) for TBSE, Hx of SCC.  IMarye Round, CMA, am acting as scribe for Sarina Ser, MD .  Documentation: I have reviewed the above documentation for accuracy and completeness, and I agree with the above.  Sarina Ser, MD

## 2020-09-03 NOTE — Patient Instructions (Addendum)

## 2020-09-04 ENCOUNTER — Other Ambulatory Visit: Payer: Self-pay

## 2020-09-04 DIAGNOSIS — L219 Seborrheic dermatitis, unspecified: Secondary | ICD-10-CM

## 2020-09-04 MED ORDER — KETOCONAZOLE 2 % EX GEL
CUTANEOUS | 3 refills | Status: DC
Start: 2020-09-04 — End: 2020-10-07

## 2020-09-04 NOTE — Progress Notes (Signed)
Name brand [employer] Dura not covered by insurance. Ketoconazole 2% gel sent instead.

## 2020-09-09 ENCOUNTER — Encounter: Payer: Self-pay | Admitting: Dermatology

## 2020-09-10 DIAGNOSIS — M9903 Segmental and somatic dysfunction of lumbar region: Secondary | ICD-10-CM | POA: Diagnosis not present

## 2020-09-10 DIAGNOSIS — M9901 Segmental and somatic dysfunction of cervical region: Secondary | ICD-10-CM | POA: Diagnosis not present

## 2020-09-10 DIAGNOSIS — M5033 Other cervical disc degeneration, cervicothoracic region: Secondary | ICD-10-CM | POA: Diagnosis not present

## 2020-09-10 DIAGNOSIS — M5416 Radiculopathy, lumbar region: Secondary | ICD-10-CM | POA: Diagnosis not present

## 2020-10-07 ENCOUNTER — Other Ambulatory Visit: Payer: Self-pay

## 2020-10-07 MED ORDER — KETOCONAZOLE 2 % EX CREA: 1.0000 "application " | TOPICAL_CREAM | CUTANEOUS | 0 refills | Status: AC

## 2020-10-07 NOTE — Progress Notes (Signed)
Xologel nor Ketoconazole Gel not covered. Called [facility] and Ketoconazole Cream is preferred. RX sent in and patient advised.

## 2020-10-08 DIAGNOSIS — M5033 Other cervical disc degeneration, cervicothoracic region: Secondary | ICD-10-CM | POA: Diagnosis not present

## 2020-10-08 DIAGNOSIS — M9901 Segmental and somatic dysfunction of cervical region: Secondary | ICD-10-CM | POA: Diagnosis not present

## 2020-10-08 DIAGNOSIS — M9903 Segmental and somatic dysfunction of lumbar region: Secondary | ICD-10-CM | POA: Diagnosis not present

## 2020-10-08 DIAGNOSIS — M5416 Radiculopathy, lumbar region: Secondary | ICD-10-CM | POA: Diagnosis not present

## 2020-11-19 DIAGNOSIS — M9901 Segmental and somatic dysfunction of cervical region: Secondary | ICD-10-CM | POA: Diagnosis not present

## 2020-11-19 DIAGNOSIS — M9903 Segmental and somatic dysfunction of lumbar region: Secondary | ICD-10-CM | POA: Diagnosis not present

## 2020-11-19 DIAGNOSIS — M5416 Radiculopathy, lumbar region: Secondary | ICD-10-CM | POA: Diagnosis not present

## 2020-11-19 DIAGNOSIS — M5033 Other cervical disc degeneration, cervicothoracic region: Secondary | ICD-10-CM | POA: Diagnosis not present

## 2020-12-17 DIAGNOSIS — M9903 Segmental and somatic dysfunction of lumbar region: Secondary | ICD-10-CM | POA: Diagnosis not present

## 2020-12-17 DIAGNOSIS — M9901 Segmental and somatic dysfunction of cervical region: Secondary | ICD-10-CM | POA: Diagnosis not present

## 2020-12-17 DIAGNOSIS — M5033 Other cervical disc degeneration, cervicothoracic region: Secondary | ICD-10-CM | POA: Diagnosis not present

## 2020-12-17 DIAGNOSIS — M5416 Radiculopathy, lumbar region: Secondary | ICD-10-CM | POA: Diagnosis not present

## 2020-12-30 DIAGNOSIS — M9901 Segmental and somatic dysfunction of cervical region: Secondary | ICD-10-CM | POA: Diagnosis not present

## 2020-12-30 DIAGNOSIS — M9903 Segmental and somatic dysfunction of lumbar region: Secondary | ICD-10-CM | POA: Diagnosis not present

## 2020-12-30 DIAGNOSIS — M5033 Other cervical disc degeneration, cervicothoracic region: Secondary | ICD-10-CM | POA: Diagnosis not present

## 2020-12-30 DIAGNOSIS — M5416 Radiculopathy, lumbar region: Secondary | ICD-10-CM | POA: Diagnosis not present

## 2021-01-14 DIAGNOSIS — M5033 Other cervical disc degeneration, cervicothoracic region: Secondary | ICD-10-CM | POA: Diagnosis not present

## 2021-01-14 DIAGNOSIS — M9901 Segmental and somatic dysfunction of cervical region: Secondary | ICD-10-CM | POA: Diagnosis not present

## 2021-01-14 DIAGNOSIS — M9903 Segmental and somatic dysfunction of lumbar region: Secondary | ICD-10-CM | POA: Diagnosis not present

## 2021-01-14 DIAGNOSIS — M5416 Radiculopathy, lumbar region: Secondary | ICD-10-CM | POA: Diagnosis not present

## 2021-02-11 DIAGNOSIS — M5033 Other cervical disc degeneration, cervicothoracic region: Secondary | ICD-10-CM | POA: Diagnosis not present

## 2021-02-11 DIAGNOSIS — M9903 Segmental and somatic dysfunction of lumbar region: Secondary | ICD-10-CM | POA: Diagnosis not present

## 2021-02-11 DIAGNOSIS — M5416 Radiculopathy, lumbar region: Secondary | ICD-10-CM | POA: Diagnosis not present

## 2021-02-11 DIAGNOSIS — M9901 Segmental and somatic dysfunction of cervical region: Secondary | ICD-10-CM | POA: Diagnosis not present

## 2021-03-18 DIAGNOSIS — M5033 Other cervical disc degeneration, cervicothoracic region: Secondary | ICD-10-CM | POA: Diagnosis not present

## 2021-03-18 DIAGNOSIS — M9903 Segmental and somatic dysfunction of lumbar region: Secondary | ICD-10-CM | POA: Diagnosis not present

## 2021-03-18 DIAGNOSIS — M9901 Segmental and somatic dysfunction of cervical region: Secondary | ICD-10-CM | POA: Diagnosis not present

## 2021-03-18 DIAGNOSIS — M5416 Radiculopathy, lumbar region: Secondary | ICD-10-CM | POA: Diagnosis not present

## 2021-03-23 ENCOUNTER — Ambulatory Visit (INDEPENDENT_AMBULATORY_CARE_PROVIDER_SITE_OTHER)
Admission: RE | Admit: 2021-03-23 | Discharge: 2021-03-23 | Disposition: A | Discharge status: Home / Self Care | Payer: BC Managed Care – PPO | Source: Ambulatory Visit | Attending: Nurse Practitioner | Admitting: Nurse Practitioner

## 2021-03-23 ENCOUNTER — Encounter: Payer: Self-pay | Admitting: Nurse Practitioner

## 2021-03-23 ENCOUNTER — Other Ambulatory Visit: Payer: Self-pay

## 2021-03-23 ENCOUNTER — Ambulatory Visit (INDEPENDENT_AMBULATORY_CARE_PROVIDER_SITE_OTHER): Payer: BC Managed Care – PPO | Admitting: Nurse Practitioner

## 2021-03-23 ENCOUNTER — Ambulatory Visit: Payer: 59 | Admitting: Family Medicine

## 2021-03-23 VITALS — BP 114/76 | HR 58 | Temp 97.5°F | Resp 14 | Ht 67.0 in | Wt 170.1 lb

## 2021-03-23 DIAGNOSIS — M47816 Spondylosis without myelopathy or radiculopathy, lumbar region: Secondary | ICD-10-CM | POA: Diagnosis not present

## 2021-03-23 DIAGNOSIS — M5442 Lumbago with sciatica, left side: Secondary | ICD-10-CM | POA: Insufficient documentation

## 2021-03-23 DIAGNOSIS — M25552 Pain in left hip: Secondary | ICD-10-CM | POA: Diagnosis not present

## 2021-03-23 DIAGNOSIS — M1612 Unilateral primary osteoarthritis, left hip: Secondary | ICD-10-CM | POA: Diagnosis not present

## 2021-03-23 LAB — COMPREHENSIVE METABOLIC PANEL
ALT: 17 U/L (ref 0–53)
AST: 21 U/L (ref 0–37)
Albumin: 4.7 g/dL (ref 3.5–5.2)
Alkaline Phosphatase: 70 U/L (ref 39–117)
BUN: 16 mg/dL (ref 6–23)
CO2: 31 mEq/L (ref 19–32)
Calcium: 9.7 mg/dL (ref 8.4–10.5)
Chloride: 101 mEq/L (ref 96–112)
Creatinine, Ser: 0.98 mg/dL (ref 0.40–1.50)
GFR: 84.67 mL/min (ref >60.00)
Glucose, Bld: 81 mg/dL (ref 70–99)
Potassium: 4.6 mEq/L (ref 3.5–5.1)
Sodium: 139 mEq/L (ref 135–145)
Total Bilirubin: 0.7 mg/dL (ref 0.2–1.2)
Total Protein: 7.5 g/dL (ref 6.0–8.3)

## 2021-03-23 LAB — CBC
HCT: 46.3 % (ref 39.0–52.0)
Hemoglobin: 15.7 g/dL (ref 13.0–17.0)
MCHC: 33.9 g/dL (ref 30.0–36.0)
MCV: 94.6 fl (ref 78.0–100.0)
Platelets: 222 10*3/uL (ref 150.0–400.0)
RBC: 4.9 Mil/uL (ref 4.22–5.81)
RDW: 12.3 % (ref 11.5–15.5)
WBC: 4.6 10*3/uL (ref 4.0–10.5)

## 2021-03-23 MED ORDER — PREDNISONE 20 MG PO TABS: ORAL_TABLET | ORAL | 0 refills | Status: AC

## 2021-03-23 NOTE — Patient Instructions (Signed)
Nice to see you today ?I will be in touch with the xray results ?Follow up as scheduled in August ?

## 2021-03-23 NOTE — Assessment & Plan Note (Signed)
Happened approximately 2 weeks ago after a skiing incident.  Patient states has had some improvement in pain been using over-the-counter Tylenol ibuprofen.  Pending x-ray images today start on prednisone avoid NSAIDs while on medication. ?

## 2021-03-23 NOTE — Assessment & Plan Note (Signed)
Patient still with left-sided lower back pain and sciatica for an extended period of time.  He has been evaluated and treated by chiropractor in the past.  States he was skiing approximately 2 weeks ago and flared it up he has had improvement since incident was still experiencing pain and discomfort.  We will update imaging of lower back and left hip start patient on prednisone taper.  Patient advised to avoid NSAIDs while on prednisone.  He may take Tylenol as needed follow-up if no improvement ?

## 2021-03-23 NOTE — Progress Notes (Signed)
? ?Acute Office Visit ? ?Subjective:  ? ? Patient ID: Benjamin Simon, male    DOB: [birth year], 61 y.o.   MRN: [id number] ? ?Chief Complaint  ?Patient presents with  ? Hip Pain  ?  Left side, for years. Has gone to chiropractor. Sciatica pain goes down the left leg and gets some join ache. Has taking tylenol and ibuprofen.   ? ? ?Hip Pain  ?Associated symptoms include numbness (bilaterl feet. States that he rides a bicycle).  ?Patient is in today for  ?Back pain and hip pain. States that he has had it for many years but has been going to chiropractor to be managed. States that it is intermittent but has been coming more often and constant. Dull achhing pain in the back that is described as burning. He is having anterior joint pain with movement and flexing it makes it worse. Can joint ?Has tired ibuprofen and tylenol with minimal relief  ? ?States that approx 2 weeks ago he went skiing an dfelt he tweeked it ? ?Past Medical History:  ?Diagnosis Date  ? History of squamous cell carcinoma in situ   ? Hx of dysplastic nevus 10/05/2016  ? R medial to inf scapula upper back, mild atypia  ? 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 L anterior deltoid, SCC IS  ? ? ?Past Surgical History:  ?Procedure Laterality Date  ? CYST EXCISION    ? x2  ? FINGER TENDON REPAIR    ? R thumb tendon repair  ? Fracture of right fibula with fractured ankle    ? Laceration RLE    ? TONSILLECTOMY    ? VASECTOMY  02/24/2006  ? Sch[facility]er  ? ? ?Family History  ?Problem Relation Age of Onset  ? Heart disease Father   ?     CAD  ? Colon cancer Neg Hx   ? Prostate cancer Neg Hx   ? ? ?Social History  ? ?Socioeconomic History  ? Marital status: Married  ?  Spouse name: Not on file  ? Number of children: Not on file  ? Years of education: Not on file  ? Highest education level: Not on file  ?Occupational History  ? Occupation: Dance movement psychotherapist  ?  Employer: [employer]  ?Tobacco Use  ? Smoking status: Never  ? Smokeless tobacco: Current   ?  Types: Chew  ?Substance and Sexual Activity  ? Alcohol use: Yes  ?  Comment: occasional   ? Drug use: No  ? Sexual activity: Not on file  ?Other Topics Concern  ? Not on file  ?Social History Narrative  ? Married 2000  ? No kids  ? Road cycling, 160 miles a week  ? [employer] grad  ? MBA at [employer]  ? ?Social Determinants of Health  ? ?Financial Resource Strain: Not on file  ?Food Insecurity: Not on file  ?Transportation Needs: Not on file  ?Physical Activity: Not on file  ?Stress: Not on file  ?Social Connections: Not on file  ?Intimate Partner Violence: Not on file  ? ? ?Outpatient Medications Prior to Visit  ?Medication Sig Dispense Refill  ? fluticasone (FLONASE) 50 MCG/ACT nasal spray Place into both nostrils daily.    ? hydrocortisone 2.5 % lotion Apply to face at bedtime Tuesday/Thursday/Saturday 59 mL 3  ? ibuprofen (ADVIL,MOTRIN) 200 MG tablet Take 200 mg by mouth every 6 (six) hours as needed.    ? ketoconazole (NIZORAL) 2 % cream Apply 1 application.  topically daily.    ? cetirizine (ZYRTEC) 10 MG tablet Take 10 mg by mouth daily as needed for allergies. (Patient not taking: Reported on 08/29/2019)    ? Multiple Vitamin (MULTIVITAMIN) tablet Take 1 tablet by mouth daily.    ? ?No facility-administered medications prior to visit.  ? ? ?No Known Allergies ? ?Review of Systems  ?Constitutional:  Negative for chills and fever.  ?Genitourinary:   ?     Denies B&B  ?Musculoskeletal:  Positive for arthralgias, back pain and gait problem.  ?Neurological:  Positive for weakness and numbness (bilaterl feet. States that he rides a bicycle).  ? ?   ?Objective:  ?  ?Physical Exam ?Constitutional:   ?   Appearance: Normal appearance.  ?Cardiovascular:  ?   Rate and Rhythm: Normal rate and regular rhythm.  ?   Pulses: Normal pulses.  ?   Heart sounds: Normal heart sounds.  ?Pulmonary:  ?   Effort: Pulmonary effort is normal.  ?   Breath sounds: Normal breath sounds.  ?Musculoskeletal:     ?   General:  Tenderness present.  ?   Lumbar back: Bony tenderness present. Positive left straight leg raise test. Negative right straight leg raise test.  ?     Back: ? ?   Left hip: Tenderness present. Decreased range of motion. Normal strength.  ?   Right lower leg: No edema.  ?   Left lower leg: No edema.  ?     Legs: ? ?   Comments: Pain with abduction and adduction   ?Skin: ?   General: Skin is warm.  ?Neurological:  ?   General: No focal deficit present.  ?   Mental Status: He is alert.  ?   Deep Tendon Reflexes:  ?   Reflex Scores: ?     Patellar reflexes are 2+ on the right side and 2+ on the left side. ?   Comments: Bilateral lower extremity strength 5/5  ? ? ?BP 114/76   Pulse (!) 58   Temp (!) 97.5 ?F (36.4 ?C)   Resp 14   Ht '5\' 7"'$  (1.702 m)   Wt 170 lb 2 oz (77.2 kg)   SpO2 98%   BMI 26.65 kg/m?  ?Wt Readings from Last 3 Encounters:  ?03/23/21 170 lb 2 oz (77.2 kg)  ?10/27/16 163 lb 4 oz (74 kg)  ?10/29/14 166 lb (75.3 kg)  ? ? ?Health Maintenance Due  ?Topic Date Due  ? HIV Screening  Never done  ? Hepatitis C Screening  Never done  ? Zoster Vaccines- Shingrix (1 of 2) Never done  ? COVID-19 Vaccine (4 - Booster for Pfizer series) 12/26/2019  ? ? ?There are no preventive care reminders to display for this patient. ? ? ?Lab Results  ?Component Value Date  ? TSH 2.45 05/26/2006  ? ?Lab Results  ?Component Value Date  ? HGB 15.4 POINT OF CARE RESULT 02/22/2007  ? ?Lab Results  ?Component Value Date  ? NA 141 07/28/2014  ? K 4.7 07/28/2014  ? CO2 29 07/28/2014  ? GLUCOSE 93 07/28/2014  ? BUN 18 07/28/2014  ? CREATININE 1.05 07/28/2014  ? BILITOT 1.3 (H) 05/26/2006  ? ALKPHOS 52 05/26/2006  ? AST 25 05/26/2006  ? ALT 19 05/26/2006  ? PROT 6.7 05/26/2006  ? ALBUMIN 4.3 05/26/2006  ? CALCIUM 9.5 07/28/2014  ? GFR 78.71 07/28/2014  ? ?Lab Results  ?Component Value Date  ? CHOL 176 07/28/2014  ? ?Lab Results  ?  Component Value Date  ? HDL 53.50 07/28/2014  ? ?Lab Results  ?Component Value Date  ? LDLCALC 106 (H)  07/28/2014  ? ?Lab Results  ?Component Value Date  ? TRIG 81.0 07/28/2014  ? ?Lab Results  ?Component Value Date  ? CHOLHDL 3 07/28/2014  ? ?No results found for: HGBA1C ? ?   ?Assessment & Plan:  ? ?Problem List Items Addressed This Visit   ? ?  ? Other  ? Left hip pain  ?  Happened approximately 2 weeks ago after a skiing incident.  Patient states has had some improvement in pain been using over-the-counter Tylenol ibuprofen.  Pending x-ray images today start on prednisone avoid NSAIDs while on medication. ?  ?  ? Relevant Medications  ? predniSONE (DELTASONE) 20 MG tablet  ? Other Relevant Orders  ? DG Hip Unilat W OR W/O Pelvis 2-3 Views Left  ? Acute left-sided back pain with sciatica - Primary  ?  Patient still with left-sided lower back pain and sciatica for an extended period of time.  He has been evaluated and treated by chiropractor in the past.  States he was skiing approximately 2 weeks ago and flared it up he has had improvement since incident was still experiencing pain and discomfort.  We will update imaging of lower back and left hip start patient on prednisone taper.  Patient advised to avoid NSAIDs while on prednisone.  He may take Tylenol as needed follow-up if no improvement ?  ?  ? Relevant Medications  ? predniSONE (DELTASONE) 20 MG tablet  ? Other Relevant Orders  ? DG Lumbar Spine Complete  ? CBC  ? Comprehensive metabolic panel  ? ? ? ?Meds ordered this encounter  ?Medications  ? predniSONE (DELTASONE) 20 MG tablet  ?  Sig: Take 1 tablet (20 mg total) by mouth 2 (two) times daily with a meal for 3 days, THEN 1 tablet (20 mg total) daily with breakfast for 3 days. Avoid NSAIDs like ibuprofen, aleve, naproxen, motrin, BC/Goody powders.  ?  Dispense:  9 tablet  ?  Refill:  0  ?  Order Specific Question:   Supervising Provider  ?  Answer:   Loura Pardon A [[id number]]  ? ?This visit occurred during the SARS-CoV-2 public health emergency.  Safety protocols were in place, including screening questions  prior to the visit, additional usage of staff PPE, and extensive cleaning of exam room while observing appropriate contact time as indicated for disinfecting solutions.  ? ?Romilda Garret, NP ? ?

## 2021-03-24 ENCOUNTER — Telehealth: Payer: Self-pay | Admitting: Family Medicine

## 2021-03-24 NOTE — Telephone Encounter (Signed)
Spoke with patient and let him know that results are not finalized yet. When results are available need to call patient vs using mychart message. He is not sure he can see those results through that. ?

## 2021-03-24 NOTE — Telephone Encounter (Signed)
Noted thank you

## 2021-03-24 NOTE — Telephone Encounter (Signed)
Pt called asking for a call back to discuss his Xray results from his visit yesterday with [facility]. Please advise. ?

## 2021-03-26 ENCOUNTER — Telehealth: Payer: Self-pay | Admitting: Family Medicine

## 2021-03-26 ENCOUNTER — Other Ambulatory Visit: Payer: Self-pay | Admitting: Nurse Practitioner

## 2021-03-26 ENCOUNTER — Telehealth: Payer: Self-pay | Admitting: Nurse Practitioner

## 2021-03-26 DIAGNOSIS — M25552 Pain in left hip: Secondary | ICD-10-CM

## 2021-03-26 DIAGNOSIS — M5442 Lumbago with sciatica, left side: Secondary | ICD-10-CM

## 2021-03-26 NOTE — Telephone Encounter (Signed)
noted 

## 2021-03-26 NOTE — Telephone Encounter (Signed)
Referral placed with comment to let patient know when referral faxed ?

## 2021-03-26 NOTE — Telephone Encounter (Signed)
Pt has called in to speak with the nurse regarding his xray results, please return call [phone] ?

## 2021-03-26 NOTE — Telephone Encounter (Signed)
Patient advised, see xray result note ?

## 2021-03-26 NOTE — Telephone Encounter (Signed)
-----   Message from [place] sent at 03/26/2021 12:58 PM EDT ----- ?Patient advised. Patient is improving but he gets this recurrent issue. Patient would like to go ahead and get P.T set up and would like to go to [place] location. Please let referral coordinator know to call patient and when the referral is faxed over.  ?

## 2021-04-13 ENCOUNTER — Encounter: Payer: Self-pay | Admitting: Physical Therapy

## 2021-04-13 ENCOUNTER — Ambulatory Visit: Payer: BC Managed Care – PPO | Attending: Nurse Practitioner | Admitting: Physical Therapy

## 2021-04-13 DIAGNOSIS — M5033 Other cervical disc degeneration, cervicothoracic region: Secondary | ICD-10-CM | POA: Diagnosis not present

## 2021-04-13 DIAGNOSIS — M5416 Radiculopathy, lumbar region: Secondary | ICD-10-CM | POA: Diagnosis not present

## 2021-04-13 DIAGNOSIS — M5459 Other low back pain: Secondary | ICD-10-CM | POA: Insufficient documentation

## 2021-04-13 DIAGNOSIS — M25552 Pain in left hip: Secondary | ICD-10-CM | POA: Diagnosis not present

## 2021-04-13 DIAGNOSIS — M5432 Sciatica, left side: Secondary | ICD-10-CM | POA: Diagnosis not present

## 2021-04-13 DIAGNOSIS — M9901 Segmental and somatic dysfunction of cervical region: Secondary | ICD-10-CM | POA: Diagnosis not present

## 2021-04-13 DIAGNOSIS — M5442 Lumbago with sciatica, left side: Secondary | ICD-10-CM | POA: Diagnosis not present

## 2021-04-13 DIAGNOSIS — M9903 Segmental and somatic dysfunction of lumbar region: Secondary | ICD-10-CM | POA: Diagnosis not present

## 2021-04-13 NOTE — Therapy (Signed)
?OUTPATIENT PHYSICAL THERAPY THORACOLUMBAR EVALUATION ? ? ?Patient Name: Benjamin Simon ?MRN: [id number] ?DOB:08-Apr-1960, 61 y.o., male ?Today's Date: 04/13/2021 ? ? PT End of Session - 04/13/21 1936   ? ? Visit Number 1   ? Number of Visits 24   ? Date for PT Re-Evaluation 07/06/21   ? Authorization Type BLUE [employer]OSS [employer] reporting period from 04/13/2021   ? Authorization Time Period 30 PT/OT/Chiro per cal yr   ? Authorization - Visit Number 1   ? Authorization - Number of Visits 30   ? Progress Note Due on Visit 10   ? PT Start Time [id number]   ? PT Stop Time 1030   ? PT Time Calculation (min) 43 min   ? Activity Tolerance Patient tolerated treatment well   ? Behavior During Therapy [facility] for tasks assessed/performed   ? ?  ?  ? ?  ? ? ?Past Medical History:  ?Diagnosis Date  ? History of squamous cell carcinoma in situ   ? Hx of dysplastic nevus 10/05/2016  ? R medial to inf scapula upper back, mild atypia  ? 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 L anterior deltoid, SCC IS  ? ?Past Surgical History:  ?Procedure Laterality Date  ? CYST EXCISION    ? x2  ? FINGER TENDON REPAIR    ? R thumb tendon repair  ? Fracture of right fibula with fractured ankle    ? Laceration RLE    ? TONSILLECTOMY    ? VASECTOMY  02/24/2006  ? Schaller  ? ?Patient Active Problem List  ? Diagnosis Date Noted  ? Left hip pain 03/23/2021  ? Acute left-sided back pain with sciatica 03/23/2021  ? Shoulder pain 10/28/2016  ? Advance care planning 08/06/2014  ? Routine general medical examination at a health care facility 09/02/2011  ? ? ?PCP: Tonia Ghent, MD ? ?REFERRING PROVIDER: Michela Pitcher, NP ? ?REFERRING DIAG: left hip pain, acute left-sided back pain with sciatica ? ?THERAPY DIAG:  ?Pain in left hip ? ?Other low back pain ? ?Sciatica, left side ? ?ONSET DATE: Exacerbation of chronic condition early March 2023 ? ?SUBJECTIVE:                                                                                                                                                                                           ? 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 He has had some on and off discomfort in the left hip for years. He finally started getting a constant discomfort in his left glute and  sciatica going down the left LE. He went skiing in [place] in march and it was awesome but it really flared his symptoms. He went to a doctor for the first time over it to see if he could get some improvement and the doctor asked if he wanted to come to PT. He was also given a prednisone taper, which he thinks had a brief affect. He started that 03/23/2021. He has seen a chiropractor for years and that has helped his symptoms. He last saw the chiropractor this morning, which helped his symptoms. Denies strength loss in leg. He feels he has been protecting his left side and not doing as much since the skiing. He has gone on a few bike rides where he did not push himself that seemed okay. He does get catching and locking in his left hip and he does have pain in deep squat. He rides a road bike 160 miles a [place]. He is an outdoors person and wants to keep doing all that stuff.  ? ?PERTINENT HISTORY:  ?Patient is a 61 y.o. male who presents to outpatient physical therapy with a referral for medical diagnosis left hip pain, acute left-sided back pain with sciatica. This patient's chief complaints consist of left hip pain and low back pain with paresthesia radiating down left LE to dorsal lateral aspect of foot leading to the following functional deficits: difficulty walking, working, cycling, skiing, anything that involves walking, yardwork, hiking, biking, walking the dog, bird watching. ?Relevant past medical history and comorbidities include skin carcinoma, right ankle fracture in 1970s (limited ROM), has numbness in R thigh after laceration there.  Patient denies hx of stroke, seizures,  lung problems, heart problems, diabetes, unexplained weight loss, unexplained changes in bowel or bladder problems, unexplained stumbling or dropping things, osteoporosis, and spinal surgery ? ?PAIN:  ?Are you having pain? Yes: NPRS scale: Current: 1/10 at rest 4-5/10 when he moves it,  Best: 2-3/10 "feels fine", Worst: 7-8/10. ?Pain location: currently left groin and upper left glute. Full range groin and left glute with pain down the lateral leg to the dorsal aspect of the left lateral foot. He does have some pain in his lower back (feels tight all the time), denies pain going down right side. Anterior hip feels like it gets stiff really quick.  ?Pain description: tightness in the low back, left groin sharp, left glute can be sharp but usually dull/nag, down the leg is dull with maybe some tingling.  ?Aggravating factors: walking, running, extending hip and back, skiiing, prolonged sitting (back and glute while sitting, anterior hip stiff when standing). ?Relieving factors: predgnisone taper (very brief), chiropractic, heating pad (helps glute), off and on with ibuprofen.  ? ?FUNCTIONAL LIMITATIONS: difficulty walking, working, cycling, skiing, anything that involves walking, yardwork, hiking, biking, walking the dog, bird watching.  ? ?PRECAUTIONS: None ? ?WEIGHT BEARING RESTRICTIONS No ? ?FALLS:  ?Has patient fallen in last 6 months? No ? ?LIVING ENVIRONMENT: ?Patient has no concerns about getting around home environment safely.  ? 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  ? ?LEISURE: hiking, biking, bird watching, He rides a road bike 160 miles a week [place]. He is an outdoors person and wants to keep doing all that stuff.  ? ?PLOF: Independent, very active ? ?PATIENT GOALS "to get that discomfort number as low as possible and be able to continue an active life" ? ? ?OBJECTIVE:  ? ?  DIAGNOSTIC FINDINGS:  ?Imaging: L hip  radiograph report 03/25/2021:  ?"FINDINGS: ?Severe bilateral femoroacetabular joint space narrowing. On lateral ?view of the left hip this is seen to be greatest at the anterior ?superior left femoroacetabular junction. Moderate ?left-greater-than-right peripheral acetabular degenerative ?osteophytes. Moderate bilateral anterior superior femoral head-neck ?junction degenerative osteophytes/convexities/CAM-type bump ?deformities. ?  ?No acute fracture or dislocation. ?  ?IMPRESSION: ?Moderate bilateral femoroacetabular osteoarthritis. ? ?Bilateral femoral head-neck junction CAM-type bump deformities which ?increase predisposition to CAM-type femoroacetabular impingement." ? ?Lumbar radiograph report 03/23/2021:  ?"IMPRESSION: ?1. Mild dextrocurvature centered at L1. ?2. Mild L5-S1 and minimal L4-5 degenerative disc changes." ? ?OBJECTIVE ? ?OBSERVATION/INSPECTION ?Posture ?Posture (standing): WFL ?Anthropometrics ?Tremor: none ?Body composition: WNL, consistent with regular physical activity ?Muscle bulk: WNL ?Skin: WNL where visualized ?Edema: none ?Functional Mobility ?Bed mobility: supine<> sit and rolling WFL ?Transfers: sit <> stand WFL ?Gait: grossly WFL for household and short community ambulation. More detailed gait analysis deferred to later date as needed.  ? ?SPINE MOTION ?Lumbar Spine AROM ?*Indicates pain ?Flexion: fingers to floor, increased pain at left SIJ region, no worse (reports usual for him, when warm can palm the floor).  ?Extension: 25% pain left groin and left SIJ region, no worse ?Side Flexion:  ? R fingers to distal patella, increased L groin pain ? L fingers to distal patella, increased L groin pain ?Rotation:  ?R WFL ?L motion WFL, increased L SIJ pin ? ?NEUROLOGICAL ?Dermatomes ?L3-S2 appears equal and intact to light touch ? ?PERIPHERAL JOINT MOTION (in degrees) ?Passive Range of Motion (PROM) ?*Indicates pain 04/13/21 Date Date  ?Joint/Motion R/L R/L R/L  ?Hip     ?Flexion  120/118* / /   ?Extension  20/10 / /  ?Abduction 38/32* / /  ?External rotation 55/50 / /  ?Internal rotation  18/5* / /  ?Comments: L IR pain in left posterior hip, L hip abduction pain at groin and posterior hip. B kne

## 2021-04-21 ENCOUNTER — Encounter: Payer: 59 | Admitting: Physical Therapy

## 2021-04-26 ENCOUNTER — Encounter: Payer: 59 | Admitting: Physical Therapy

## 2021-04-28 ENCOUNTER — Encounter: Payer: 59 | Admitting: Physical Therapy

## 2021-05-03 ENCOUNTER — Ambulatory Visit: Payer: BC Managed Care – PPO | Attending: Nurse Practitioner | Admitting: Physical Therapy

## 2021-05-03 ENCOUNTER — Encounter: Payer: Self-pay | Admitting: Physical Therapy

## 2021-05-03 ENCOUNTER — Encounter: Payer: 59 | Admitting: Physical Therapy

## 2021-05-03 DIAGNOSIS — M5432 Sciatica, left side: Secondary | ICD-10-CM | POA: Insufficient documentation

## 2021-05-03 DIAGNOSIS — M25552 Pain in left hip: Secondary | ICD-10-CM | POA: Insufficient documentation

## 2021-05-03 DIAGNOSIS — M5459 Other low back pain: Secondary | ICD-10-CM | POA: Insufficient documentation

## 2021-05-03 NOTE — Therapy (Signed)
?OUTPATIENT PHYSICAL THERAPY TREATMENT NOTE ? ? ?Patient Name: Benjamin Simon ?MRN: [id number] ?DOB:07-Jul-1960, 61 y.o., male ?Today's Date: 05/03/2021 ? ?PCP: Elveria Rising. Damita Dunnings, MD ?REFERRING PROVIDER: Michela Pitcher, NP ? ?END OF SESSION:  ? PT End of Session - 05/03/21 2017   ? ? Visit Number 2   ? Number of Visits 24   ? Date for PT Re-Evaluation 07/06/21   ? Authorization Type [employer] reporting period from 04/13/2021   ? Authorization Time Period 30 PT/OT/Chiro per cal yr   ? Authorization - Visit Number 2   ? Authorization - Number of Visits 30   ? Progress Note Due on Visit 10   ? PT Start Time 6720   ? PT Stop Time 9470   ? PT Time Calculation (min) 41 min   ? Activity Tolerance Patient tolerated treatment well   ? Behavior During Therapy [facility] for tasks assessed/performed   ? ?  ?  ? ?  ? ? ?Past Medical History:  ?Diagnosis Date  ? History of squamous cell carcinoma in situ   ? Hx of dysplastic nevus 10/05/2016  ? R medial to inf scapula upper back, mild atypia  ? 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 L anterior deltoid, SCC IS  ? ?Past Surgical History:  ?Procedure Laterality Date  ? CYST EXCISION    ? x2  ? FINGER TENDON REPAIR    ? R thumb tendon repair  ? Fracture of right fibula with fractured ankle    ? Laceration RLE    ? TONSILLECTOMY    ? VASECTOMY  02/24/2006  ? Sch[facility]er  ? ?Patient Active Problem List  ? Diagnosis Date Noted  ? Left hip pain 03/23/2021  ? Acute left-sided back pain with sciatica 03/23/2021  ? Shoulder pain 10/28/2016  ? Advance care planning 08/06/2014  ? Routine general medical examination at a health care facility 09/02/2011  ? ? ?REFERRING DIAG: left hip pain, acute left-sided back pain with sciatica ? ?THERAPY DIAG:  ?Pain in left hip ? ?Other low back pain ? ?Sciatica, left side ? ?PERTINENT HISTORY: Patient is a 61 y.o. male who presents to outpatient physical therapy with a referral for medical diagnosis left hip pain, acute left-sided back pain with  sciatica. This patient's chief complaints consist of left hip pain and low back pain with paresthesia radiating down left LE to dorsal lateral aspect of foot leading to the following functional deficits: difficulty walking, working, cycling, skiing, anything that involves walking, yardwork, hiking, biking, walking the dog, bird watching. Relevant past medical history and comorbidities include skin carcinoma, right ankle fracture in 1970s (limited ROM), has numbness in R thigh after laceration there.  Patient denies hx of stroke, seizures, lung problems, heart problems, diabetes, unexplained weight loss, unexplained changes in bowel or bladder problems, unexplained stumbling or dropping things, osteoporosis, and spinal surgery ? ?PRECAUTIONS: none ? ?SUBJECTIVE: Patient reports he continues to have constant pain in the left hip. He states he rode his road bike today for 30 min already so his legs are a bit tired but his pain decreases about 15 min into the ride. He rode a week before last over 100 miles. He does not remember if his hip hurts the day after his rides. He is going on vacation next week. He plans to fly fish.  ? ?PAIN:  ?Are you having pain? Yes: NPRS scale: 4-5/10 in the left hip (anterior, lateral, and posterior).  ? ? ?OBJECTIVE:  ?  TODAY'S TREATMENT:  ?Therapeutic exercise: to centralize symptoms and improve ROM, strength, muscular endurance, and activity tolerance required for successful completion of functional activities.  ?- Recumbent Bike level 1. For improved lower extremity ROM, muscular endurance, and activity tolerance; and to induce the analgesic effect of aerobic exercise, stimulate joint nutrition, and prepare body structures and systems for following interventions. x 5  minutes. ?(Manual/dry needling - see below). ?- standing hip abduction at machine, 1x10 each direciton at 55lbs (painful with left hip moving).  ?- standing step standing pallof press with BlueTB, 1x10 each side with each  foot. (Feels work and some discomfort with L LE down).  ?- forwards/backwards monster walks with YTB looped around ankles, 2x20 feet each direction (no pain, feels work around B lateral hips).  ?- Education on HEP including handout  ? ?Manual therapy: to reduce pain and tissue tension, improve range of motion, neuromodulation, in order to promote improved ability to complete functional activities. ?PRONE and SIDELYING (L side up) ?- STM to left glute max, min, med and deep hip external rotators including with PROM hip IR/ER and contract-relax hip ER.  ? ? ?Modality: (unbilled) ?Dry needling performed to left hip to decrease pain and spasms along patient?s left hip and LE region with patient sidelying with left hip up utilizing (1) dry needle(s) .68m x 657mwith (2) sticks at left glute med and 1 stick at left piriformis . Patient educated about the risks and benefits from therapy and verbally consents to treatment.  ?Dry needling performed by SaEverlean AlstromSnGraylon GoodT, DPT who is certified in this technique. ? ?  ?PATIENT EDUCATION: ?Education details: Education on diagnosis, prognosis, POC, anatomy and physiology of current condition.  ?Person educated: Patient ?Education method: Explanation ?Education comprehension: verbalized understanding and needs further education ?  ?  ?HOME EXERCISE PROGRAM: ?Access Code: [email or web address] ?URL: [email or web address] ?Date: 05/03/2021 ?Prepared by: SaRosita Kea ?Exercises ?- Single-Leg Anti-Rotation Press With Anchored Resistance  - 3-5 x weekly - 2-33 sets - 10 reps ?- Company secretary- 1 x daily - 3-5 x weekly - 3 sets - 20 feet ?- Backward Monster Walks  - 1 x daily - 3-5 x weekly - 3 sets - 20 feet ?  ?  ?ASSESSMENT: ?  ?CLINICAL IMPRESSION: ?Patient tolerated treatment well with mild reduction in pain by end onf session. Session focused on interventions for pain control, L hip and lumbar stability, and to initiate HEP. Patient had mild response to dry needling and  plan to assess longer term effects at next session. Patient continues to have significant pain at the left hip and LE including radiation to the top of his left foot. Patient would benefit from continued management of limiting condition by skilled physical therapist to address remaining impairments and functional limitations to work towards stated goals and return to PLOF or maximal functional independence.  ? ?Patient is a [age].o. male referred to outpatient physical therapy with a medical diagnosis of left hip pain, acute left-sided back pain with sciatica who presents with signs and symptoms consistent with acute on chronic left hip pain that appears to be contributing most strongly to current symptoms and acute on chronic low back pain with intermittent radiation to L LE to dorsal aspect of foot. Patient presents with significant pain, paresthesia, ROM, joint stiffness, muscle tension, muscle performance (strength/power/endurance) and activity tolerance impairments that are limiting ability to complete his usual activities including walking, working, cycling, skiing, anything that involves walking, yardwork, hiking,  biking, walking the dog, bird watching without difficulty and decreases his quality of life. Patient will benefit from skilled physical therapy intervention to address current body structure impairments and activity limitations to improve function and work towards goals set in current POC in order to return to prior level of function or maximal functional improvement.  ?  ?  ?  ?OBJECTIVE IMPAIRMENTS decreased activity tolerance, decreased mobility, difficulty walking, decreased ROM, decreased strength, hypomobility, impaired perceived functional ability, impaired flexibility, and pain.  ?  ?ACTIVITY LIMITATIONS community activity, occupation, yard work, and walking, working, cycling, skiing, anything that involves walking, yardwork, hiking, biking, walking the dog, bird watching.  ?  ?PERSONAL  FACTORS Past/current experiences, Time since onset of injury/illness/exacerbation, and 3+ comorbidities:  skin carcinoma, right ankle fracture in 1970s (limited ROM), has numbness in R thigh after laceration t

## 2021-05-04 DIAGNOSIS — M9903 Segmental and somatic dysfunction of lumbar region: Secondary | ICD-10-CM | POA: Diagnosis not present

## 2021-05-04 DIAGNOSIS — M5033 Other cervical disc degeneration, cervicothoracic region: Secondary | ICD-10-CM | POA: Diagnosis not present

## 2021-05-04 DIAGNOSIS — M5416 Radiculopathy, lumbar region: Secondary | ICD-10-CM | POA: Diagnosis not present

## 2021-05-04 DIAGNOSIS — M9901 Segmental and somatic dysfunction of cervical region: Secondary | ICD-10-CM | POA: Diagnosis not present

## 2021-05-04 NOTE — Therapy (Signed)
?OUTPATIENT PHYSICAL THERAPY TREATMENT NOTE ? ? ?Patient Name: Benjamin Simon ?MRN: [id number] ?DOB:Mar 16, 1960, 61 y.o., male ?Today's Date: 05/05/2021 ? ?PCP: Elveria Rising. Damita Dunnings, MD ?REFERRING PROVIDER: Michela Pitcher, NP ? ?END OF SESSION:  ? PT End of Session - 05/05/21 1002   ? ? Visit Number 3   ? Number of Visits 24   ? Date for PT Re-Evaluation 07/06/21   ? Authorization Type [employer] reporting period from 04/13/2021   ? Authorization Time Period 30 PT/OT/Chiro per cal yr   ? Authorization - Visit Number 3   ? Authorization - Number of Visits 30   ? Progress Note Due on Visit 10   ? PT Start Time [phone]   ? PT Stop Time 8366   ? PT Time Calculation (min) 40 min   ? Activity Tolerance Patient tolerated treatment well   ? Behavior During Therapy [facility] for tasks assessed/performed   ? ?  ?  ? ?  ? ? ? ?Past Medical History:  ?Diagnosis Date  ? History of squamous cell carcinoma in situ   ? Hx of dysplastic nevus 10/05/2016  ? R medial to inf scapula upper back, mild atypia  ? 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 L anterior deltoid, SCC IS  ? ?Past Surgical History:  ?Procedure Laterality Date  ? CYST EXCISION    ? x2  ? FINGER TENDON REPAIR    ? R thumb tendon repair  ? Fracture of right fibula with fractured ankle    ? Laceration RLE    ? TONSILLECTOMY    ? VASECTOMY  02/24/2006  ? [facility]er  ? ?Patient Active Problem List  ? Diagnosis Date Noted  ? Left hip pain 03/23/2021  ? Acute left-sided back pain with sciatica 03/23/2021  ? Shoulder pain 10/28/2016  ? Advance care planning 08/06/2014  ? Routine general medical examination at a health care facility 09/02/2011  ? ? ?REFERRING DIAG: left hip pain, acute left-sided back pain with sciatica ? ?THERAPY DIAG:  ?Pain in left hip ? ?Other low back pain ? ?Sciatica, left side ? ?PERTINENT HISTORY: Patient is a 61 y.o. male who presents to outpatient physical therapy with a referral for medical diagnosis left hip pain, acute left-sided back pain with  sciatica. This patient's chief complaints consist of left hip pain and low back pain with paresthesia radiating down left LE to dorsal lateral aspect of foot leading to the following functional deficits: difficulty walking, working, cycling, skiing, anything that involves walking, yardwork, hiking, biking, walking the dog, bird watching. Relevant past medical history and comorbidities include skin carcinoma, right ankle fracture in 1970s (limited ROM), has numbness in R thigh after laceration there.  Patient denies hx of stroke, seizures, lung problems, heart problems, diabetes, unexplained weight loss, unexplained changes in bowel or bladder problems, unexplained stumbling or dropping things, osteoporosis, and spinal surgery ? ?PRECAUTIONS: none ? ?SUBJECTIVE: Patient reports his left hip is feeling about the same. He did a long bike ride yesterday and did his HEP, so his legs feel tired. He did not have as much pain down his left leg yesterday compared to usual.  ? ?PAIN:  ?Are you having pain? Yes: NPRS scale: 4-5/10 in the left hip (anterior, lateral, and posterior).  ? ? ?OBJECTIVE:  ?TODAY'S TREATMENT:  ?Therapeutic exercise: to centralize symptoms and improve ROM, strength, muscular endurance, and activity tolerance required for successful completion of functional activities.  ?- Recumbent Bike level 1. For improved  lower extremity ROM, muscular endurance, and activity tolerance; and to induce the analgesic effect of aerobic exercise, stimulate joint nutrition, and prepare body structures and systems for following interventions. x 5  minutes. ?- standing hip abduction at machine, 3x10 each direciton at 55lbs (painful with left hip moving at end end range).  ?(Manual therapy - see below) ?- hooklying bridge with hip abduction against BlackTB, 1x20 with 5 second holds (left groin pain).  ?- left hip extension stretch with knee on table with posterior pelvic tilt, 2x10 with 5 second hold.  ?- Education on HEP  including handout  ? ?Manual therapy: to reduce pain and tissue tension, improve range of motion, neuromodulation, in order to promote improved ability to complete functional activities. ?SUPINE ?L hip joint mobilizations grades III-IV ? - long axis distraction 3x30 seconds ?- posterolateral glide, 2x30 seconds ?- caudal glide, 3x30 seconds (least comfortable).  ?  ?PATIENT EDUCATION: ?Education details: Education on diagnosis, prognosis, POC, anatomy and physiology of current condition.  ?Person educated: Patient ?Education method: Explanation ?Education comprehension: verbalized understanding and needs further education ?  ?  ?HOME EXERCISE PROGRAM: ?Access Code: [email or web address] ?URL: [email or web address] ?Date: 05/05/2021 ?Prepared by: Rosita Kea ? ?Exercises ?- Single-Leg Anti-Rotation Press With Anchored Resistance  - 3-5 x weekly - 2-33 sets - 10 reps ?Company secretary  - 1 x daily - 3-5 x weekly - 3 sets - 20 feet ?- Backward Monster Walks  - 1 x daily - 3-5 x weekly - 3 sets - 20 feet ?- Half Kneeling Hip Flexor Stretch  - 1 x daily - 1-2 sets - 10 reps - 5 seconds hold ?  ?  ?ASSESSMENT: ?  ?CLINICAL IMPRESSION: ?Patient tolerated treatment well with report of reduced pain after manual therapy and hip flexor stretch. Updated HEP to include hip flexor stretch. Patient to be on vacation next week. Plan to continue with manual techniques for pain control and strengthening/motor control exercises progressing as tolerated next session. Patient would benefit from continued management of limiting condition by skilled physical therapist to address remaining impairments and functional limitations to work towards stated goals and return to PLOF or maximal functional independence.  ? ? ?Patient is a 61 y.o. male referred to outpatient physical therapy with a medical diagnosis of left hip pain, acute left-sided back pain with sciatica who presents with signs and symptoms consistent with acute on chronic  left hip pain that appears to be contributing most strongly to current symptoms and acute on chronic low back pain with intermittent radiation to L LE to dorsal aspect of foot. Patient presents with significant pain, paresthesia, ROM, joint stiffness, muscle tension, muscle performance (strength/power/endurance) and activity tolerance impairments that are limiting ability to complete his usual activities including walking, working, cycling, skiing, anything that involves walking, yardwork, hiking, biking, walking the dog, bird watching without difficulty and decreases his quality of life. Patient will benefit from skilled physical therapy intervention to address current body structure impairments and activity limitations to improve function and work towards goals set in current POC in order to return to prior level of function or maximal functional improvement.  ?  ?  ?  ?OBJECTIVE IMPAIRMENTS decreased activity tolerance, decreased mobility, difficulty walking, decreased ROM, decreased strength, hypomobility, impaired perceived functional ability, impaired flexibility, and pain.  ?  ?ACTIVITY LIMITATIONS community activity, occupation, yard work, and walking, working, cycling, skiing, anything that involves walking, yardwork, hiking, biking, walking the dog, bird watching.  ?  ?PERSONAL FACTORS Past/current experiences,  Time since onset of injury/illness/exacerbation, and 3+ comorbidities:  skin carcinoma, right ankle fracture in 1970s (limited ROM), has numbness in R thigh after laceration there are also affecting patient's functional outcome.  ?  ?  ?REHAB POTENTIAL: Good ?  ?CLINICAL DECISION MAKING: Stable/uncomplicated ?  ?EVALUATION COMPLEXITY: Low ?  ?  ?GOALS: ?Goals reviewed with patient? No ?  ?SHORT TERM GOALS: Target date: 04/27/2021 ?  ?Patient will be independent with initial home exercise program for self-management of symptoms. ?Baseline: Initial HEP to be provided at visit 2 as appropriate  (04/13/21); initial HEP provided at visit 2 (05/03/2021);  ?Goal status: In-progress ?  ?  ?LONG TERM GOALS: Target date: 07/06/2021 ?  ?Patient will be independent with a long-term home exercise program for self-mana

## 2021-05-05 ENCOUNTER — Encounter: Payer: Self-pay | Admitting: Physical Therapy

## 2021-05-05 ENCOUNTER — Encounter: Payer: 59 | Admitting: Physical Therapy

## 2021-05-05 ENCOUNTER — Ambulatory Visit: Payer: BC Managed Care – PPO | Admitting: Physical Therapy

## 2021-05-05 DIAGNOSIS — M5432 Sciatica, left side: Secondary | ICD-10-CM | POA: Diagnosis not present

## 2021-05-05 DIAGNOSIS — M5459 Other low back pain: Secondary | ICD-10-CM | POA: Diagnosis not present

## 2021-05-05 DIAGNOSIS — M25552 Pain in left hip: Secondary | ICD-10-CM | POA: Diagnosis not present

## 2021-05-06 ENCOUNTER — Encounter: Payer: 59 | Admitting: Physical Therapy

## 2021-05-13 NOTE — Therapy (Signed)
?OUTPATIENT PHYSICAL THERAPY TREATMENT NOTE ? ? ?Patient Name: Benjamin Simon ?MRN: [id number] ?DOB:1960/03/15, 61 y.o., male ?Today's Date: 05/17/2021 ? ?PCP: Elveria Rising. Damita Dunnings, MD ?REFERRING PROVIDER: Michela Pitcher, NP ? ?END OF SESSION:  ? PT End of Session - 05/17/21 2000   ? ? Visit Number 4   ? Number of Visits 24   ? Date for PT Re-Evaluation 07/06/21   ? Authorization Type [employer] reporting period from 04/13/2021   ? Authorization Time Period 30 PT/OT/Chiro per cal yr   ? Authorization - Visit Number 4   ? Authorization - Number of Visits 30   ? Progress Note Due on Visit 10   ? PT Start Time 1905   ? PT Stop Time 1945   ? PT Time Calculation (min) 40 min   ? Activity Tolerance Patient tolerated treatment well   ? Behavior During Therapy [facility] for tasks assessed/performed   ? ?  ?  ? ?  ? ? ? ? ?Past Medical History:  ?Diagnosis Date  ? History of squamous cell carcinoma in situ   ? Hx of dysplastic nevus 10/05/2016  ? R medial to inf scapula upper back, mild atypia  ? 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 L anterior deltoid, SCC IS  ? ?Past Surgical History:  ?Procedure Laterality Date  ? CYST EXCISION    ? x2  ? FINGER TENDON REPAIR    ? R thumb tendon repair  ? Fracture of right fibula with fractured ankle    ? Laceration RLE    ? TONSILLECTOMY    ? VASECTOMY  02/24/2006  ? [facility]  ? ?Patient Active Problem List  ? Diagnosis Date Noted  ? Left hip pain 03/23/2021  ? Acute left-sided back pain with sciatica 03/23/2021  ? Shoulder pain 10/28/2016  ? Advance care planning 08/06/2014  ? Routine general medical examination at a health care facility 09/02/2011  ? ? ?REFERRING DIAG: left hip pain, acute left-sided back pain with sciatica ? ?THERAPY DIAG:  ?Pain in left hip ? ?Other low back pain ? ?Sciatica, left side ? ?PERTINENT HISTORY: Patient is a 61 y.o. male who presents to outpatient physical therapy with a referral for medical diagnosis left hip pain, acute left-sided back pain with  sciatica. This patient's chief complaints consist of left hip pain and low back pain with paresthesia radiating down left LE to dorsal lateral aspect of foot leading to the following functional deficits: difficulty walking, working, cycling, skiing, anything that involves walking, yardwork, hiking, biking, walking the dog, bird watching. Relevant past medical history and comorbidities include skin carcinoma, right ankle fracture in 1970s (limited ROM), has numbness in R thigh after laceration there.  Patient denies hx of stroke, seizures, lung problems, heart problems, diabetes, unexplained weight loss, unexplained changes in bowel or bladder problems, unexplained stumbling or dropping things, osteoporosis, and spinal surgery ? ?PRECAUTIONS: none ? ?SUBJECTIVE: Patient reports his left hip is feeling really good today. It was acting up over the last two days but today is better. Patient reports some nagging pain at the top of his left glute. He was on vacation and fished from his kayak every day. He did a little biking cruising around. He plans to go back to biking tomorrow (Tuesdays/Thursdays and both weekend days is his routine with Sunday easy day). He thinks not walking as much while he was on vacation helped his hip some. He went back to work today and is unsure  why his hip hurt the last two days. He has been doing his HEP except the pallof exercise. The pain in his left shin has not been noticeable all week and definitely better. He felt okay after last PT session.  ? ?PAIN:  ?Are you having pain? Yes: NPRS scale: 2/10 at left base of spine  ? ?OBJECTIVE:  ?TODAY'S TREATMENT:  ?Therapeutic exercise: to centralize symptoms and improve ROM, strength, muscular endurance, and activity tolerance required for successful completion of functional activities.  ?- Recumbent Bike level 1. For improved lower extremity ROM, muscular endurance, and activity tolerance; and to induce the analgesic effect of aerobic exercise,  stimulate joint nutrition, and prepare body structures and systems for following interventions. x 5  minutes. ?- standing hip abduction at machine, 3x10 each direction at 55lbs (painful with left hip moving at end end range).  ?(Manual therapy/dry needling - see below) ?- prone alternating hip extension with abdominal brace and knee extended (multifidus kick), 2x10 each side.  ? ?Manual therapy: to reduce pain and tissue tension, improve range of motion, neuromodulation, in order to promote improved ability to complete functional activities. ?SUPINE ?L hip joint mobilizations grades III-IV: ? - long axis distraction 3x30 seconds ?- posterolateral glide, 2x30 seconds ?- caudal glide, 3x30 seconds ?PRONE ?- STM to left posterior hip, glutes, and bilateral lumbar paraspinals L > R.  ? ?Modality: (unbilled) ?Dry needling performed to low back to decrease pain and spasms along patient?s low back, left hip, and left leg region with patient in prone utilizing 2 dry needle(s) .70m x 659mwith 3 sticks at left lumbar multifidi at approx L3, L4, and L5 and right lumbar multifidi at approx L4. Patient educated about the risks and benefits from therapy and verbally consents to treatment.  ?Dry needling performed by SaEverlean AlstromSnGraylon GoodT, DPT who is certified in this technique. ? ? ? ?  ?PATIENT EDUCATION: ?Education details: Education on diagnosis, prognosis, POC, anatomy and physiology of current condition.  ?Person educated: Patient ?Education method: Explanation ?Education comprehension: verbalized understanding and needs further education ?  ?  ?HOME EXERCISE PROGRAM: ?Access Code: [email or web address] ?URL: [email or web address] ?Date: 05/05/2021 ?Prepared by: SaRosita Kea ?Exercises ?- Single-Leg Anti-Rotation Press With Anchored Resistance  - 3-5 x weekly - 2-33 sets - 10 reps ?- Company secretary- 1 x daily - 3-5 x weekly - 3 sets - 20 feet ?- Backward Monster Walks  - 1 x daily - 3-5 x weekly - 3 sets - 20  feet ?- Half Kneeling Hip Flexor Stretch  - 1 x daily - 1-2 sets - 10 reps - 5 seconds hold ?  ?  ?ASSESSMENT: ?  ?CLINICAL IMPRESSION: ?Patient tolerated treatment well with report of decreased pain by end of session. Patient continued to have left hip pain at end range left hip abduction on machine. He had concordant pain to STM at left lumbar paraspinals so dry needling was utilized in this region to decrease spasm and pain.  Patient noted pain referral into glute and L LE with needling to the left lumbar multifidi and overlying musculature that resolved when needles were removed. Plan to continue with needling/manual therapy as needed and hip and core strengthening as appropriate next session. Patient would benefit from continued management of limiting condition by skilled physical therapist to address remaining impairments and functional limitations to work towards stated goals and return to PLOF or maximal functional independence.  ? ?Patient is a [age].o. male referred to outpatient  physical therapy with a medical diagnosis of left hip pain, acute left-sided back pain with sciatica who presents with signs and symptoms consistent with acute on chronic left hip pain that appears to be contributing most strongly to current symptoms and acute on chronic low back pain with intermittent radiation to L LE to dorsal aspect of foot. Patient presents with significant pain, paresthesia, ROM, joint stiffness, muscle tension, muscle performance (strength/power/endurance) and activity tolerance impairments that are limiting ability to complete his usual activities including walking, working, cycling, skiing, anything that involves walking, yardwork, hiking, biking, walking the dog, bird watching without difficulty and decreases his quality of life. Patient will benefit from skilled physical therapy intervention to address current body structure impairments and activity limitations to improve function and work towards goals  set in current POC in order to return to prior level of function or maximal functional improvement.  ?  ?  ?  ?OBJECTIVE IMPAIRMENTS decreased activity tolerance, decreased mobility, difficulty walking, dec

## 2021-05-17 ENCOUNTER — Ambulatory Visit: Payer: BC Managed Care – PPO | Admitting: Physical Therapy

## 2021-05-17 ENCOUNTER — Encounter: Payer: Self-pay | Admitting: Physical Therapy

## 2021-05-17 DIAGNOSIS — M5432 Sciatica, left side: Secondary | ICD-10-CM

## 2021-05-17 DIAGNOSIS — M25552 Pain in left hip: Secondary | ICD-10-CM

## 2021-05-17 DIAGNOSIS — M5459 Other low back pain: Secondary | ICD-10-CM

## 2021-05-20 ENCOUNTER — Ambulatory Visit: Payer: BC Managed Care – PPO | Admitting: Physical Therapy

## 2021-05-20 ENCOUNTER — Encounter: Payer: Self-pay | Admitting: Physical Therapy

## 2021-05-20 DIAGNOSIS — M5432 Sciatica, left side: Secondary | ICD-10-CM | POA: Diagnosis not present

## 2021-05-20 DIAGNOSIS — M25552 Pain in left hip: Secondary | ICD-10-CM | POA: Diagnosis not present

## 2021-05-20 DIAGNOSIS — M5459 Other low back pain: Secondary | ICD-10-CM

## 2021-05-20 NOTE — Therapy (Signed)
OUTPATIENT PHYSICAL THERAPY TREATMENT NOTE   Patient Name: Benjamin Simon MRN: [id number] DOB:Feb 22, 1960, 61 y.o., male Today's Date: 05/20/2021  PCP: Elveria Rising. Damita Dunnings, MD REFERRING PROVIDER: Michela Pitcher, NP  END OF SESSION:   PT End of Session - 05/20/21 1937     Visit Number 5    Number of Visits 24    Date for PT Re-Evaluation 07/06/21    Authorization Type [employer] reporting period from 04/13/2021    Authorization Time Period 30 PT/OT/Chiro per cal yr    Authorization - Visit Number 5    Authorization - Number of Visits 30    Progress Note Due on Visit 10    PT Start Time 6387    PT Stop Time 1819    PT Time Calculation (min) 42 min    Activity Tolerance Patient tolerated treatment well    Behavior During Therapy WFL for tasks assessed/performed                Past Medical History:  Diagnosis Date   History of squamous cell carcinoma in situ    Hx of dysplastic nevus 10/05/2016   R medial to inf scapula upper back, mild atypia   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   L anterior deltoid, SCC IS   Past Surgical History:  Procedure Laterality Date   CYST EXCISION     x2   FINGER TENDON REPAIR     R thumb tendon repair   Fracture of right fibula with fractured ankle     Laceration RLE     TONSILLECTOMY     VASECTOMY  02/24/2006   [facility]   Patient Active Problem List   Diagnosis Date Noted   Left hip pain 03/23/2021   Acute left-sided back pain with sciatica 03/23/2021   Shoulder pain 10/28/2016   Advance care planning 08/06/2014   Routine general medical examination at a health care facility 09/02/2011    REFERRING DIAG: left hip pain, acute left-sided back pain with sciatica  THERAPY DIAG:  Pain in left hip  Other low back pain  Sciatica, left side  Rationale for Evaluation and Treatment: Rehabilitation  PERTINENT HISTORY: Patient is a 61 y.o. male who presents to outpatient physical therapy with a referral for  medical diagnosis left hip pain, acute left-sided back pain with sciatica. This patient's chief complaints consist of left hip pain and low back pain with paresthesia radiating down left LE to dorsal lateral aspect of foot leading to the following functional deficits: difficulty walking, working, cycling, skiing, anything that involves walking, yardwork, hiking, biking, walking the dog, bird watching. Relevant past medical history and comorbidities include skin carcinoma, right ankle fracture in 1970s (limited ROM), has numbness in R thigh after laceration there.  Patient denies hx of stroke, seizures, lung problems, heart problems, diabetes, unexplained weight loss, unexplained changes in bowel or bladder problems, unexplained stumbling or dropping things, osteoporosis, and spinal surgery  PRECAUTIONS: none  SUBJECTIVE: Patient reports he is feeling okay today and had a nice bike ride today and did some yardwork. States his back feels like it stiffens up when he sits still but has not noticed pain referring down his left leg as much as before. He has some questions about how much he might expect in his bill after insurance. He also notes he is feeling better and probably back to how he felt before exacerbating his pain with skiing but is curious how much more improvement he can get  with PT.   PAIN:  Are you having pain? Yes: NPRS scale: 2-3/10 at left anterior hip.   OBJECTIVE  SELF-REPORTED FUNCTION FOTO score: 69/100 (hip questionnaire)   TODAY'S TREATMENT:  Therapeutic exercise: to centralize symptoms and improve ROM, strength, muscular endurance, and activity tolerance required for successful completion of functional activities.  - Recumbent Bike level 1. For improved lower extremity ROM, muscular endurance, and activity tolerance; and to induce the analgesic effect of aerobic exercise, stimulate joint nutrition, and prepare body structures and systems for following interventions. x 5   minutes. (Manual therapy/dry needling - see below) - prone alternating hip extension with abdominal brace and knee extended (multifidus kick), 2x10 each side.  - hooklying articulated bridge to marching in bridge position, 1x10 each side (reports mild pain in left low back, instructed to improve pelvic tilt but unable to try due to left hamstring cramping).  - L hamstring stretch PROM by PT, 2x20 seconds to improve cramping.  - education on how to use foam roller on left hip flexors.   Manual therapy: to reduce pain and tissue tension, improve range of motion, neuromodulation, in order to promote improved ability to complete functional activities. SUPINE L hip joint mobilizations grades III-IV:  - long axis distraction 3x30 seconds - posterolateral glide, 2x30 seconds - caudal glide, 2x30 seconds PRONE - STM to bilateral lumbar paraspinals L > R.  - L hip joint PA joint mobilization grade III-IV, 3x30 seconds - L hip PA joint mobilization in ER, grade III-IV, 2x30 second.   Modality: (unbilled) Dry needling performed to low back to decrease pain and spasms along patient's low back, left hip, and left leg region with patient in prone utilizing 2 dry needle(s) .60m x 671mwith 2 sticks at left lumbar multifidi at approx L4, and L5 and right lumbar multifidi at approx L4. Patient educated about the risks and benefits from therapy and verbally consents to treatment.  Dry needling performed by Benjamin AlstromSnGraylon GoodT, DPT who is certified in this technique.      PATIENT EDUCATION: Education details: Education on diagnosis, prognosis, POC, anatomy and physiology of current condition.  Person educated: Patient Education method: Explanation Education comprehension: verbalized understanding and needs further education     HOME EXERCISE PROGRAM: Access Code: [email or web address] URL: [email or web address] Date: 05/20/2021 Prepared by: SaRosita KeaExercises - Single-Leg Anti-Rotation Press  With Anchored Resistance  - 3-5 x weekly - 2-33 sets - 10 reps - Forward Monster Walks  - 1 x daily - 3-5 x weekly - 3 sets - 20 feet - Backward Monster Walks  - 1 x daily - 3-5 x weekly - 3 sets - 20 feet - Half Kneeling Hip Flexor Stretch  - 1 x daily - 1-2 sets - 10 reps - 5 seconds hold - Marching Bridge  - 3-5 x weekly - 3 sets - 10 reps     ASSESSMENT:   CLINICAL IMPRESSION: Patient tolerated treatment well overall but did not have reduction in pain by end of today's session. He had some soreness in the left lumbar region and continued to have anterior left hip pain.  Bridge with marching exercise was introduced to improve hip and lumbar stability, muscular endurance, and strength. Plan to further assess left anterior hip for soft tissue tension next session as appropriate. Patient would benefit from continued management of limiting condition by skilled physical therapist to address remaining impairments and functional limitations to work towards stated goals and return to PLOF or maximal  functional independence.   Patient is a 61 y.o. male referred to outpatient physical therapy with a medical diagnosis of left hip pain, acute left-sided back pain with sciatica who presents with signs and symptoms consistent with acute on chronic left hip pain that appears to be contributing most strongly to current symptoms and acute on chronic low back pain with intermittent radiation to L LE to dorsal aspect of foot. Patient presents with significant pain, paresthesia, ROM, joint stiffness, muscle tension, muscle performance (strength/power/endurance) and activity tolerance impairments that are limiting ability to complete his usual activities including walking, working, cycling, skiing, anything that involves walking, yardwork, hiking, biking, walking the dog, bird watching without difficulty and decreases his quality of life. Patient will benefit from skilled physical therapy intervention to address current  body structure impairments and activity limitations to improve function and work towards goals set in current POC in order to return to prior level of function or maximal functional improvement.        OBJECTIVE IMPAIRMENTS decreased activity tolerance, decreased mobility, difficulty walking, decreased ROM, decreased strength, hypomobility, impaired perceived functional ability, impaired flexibility, and pain.    ACTIVITY LIMITATIONS community activity, occupation, yard work, and walking, working, cycling, skiing, anything that involves walking, yardwork, hiking, biking, walking the dog, bird watching.    PERSONAL FACTORS Past/current experiences, Time since onset of injury/illness/exacerbation, and 3+ comorbidities:  skin carcinoma, right ankle fracture in 1970s (limited ROM), has numbness in R thigh after laceration there are also affecting patient's functional outcome.      REHAB POTENTIAL: Good   CLINICAL DECISION MAKING: Stable/uncomplicated   EVALUATION COMPLEXITY: Low     GOALS: Goals reviewed with patient? No   SHORT TERM GOALS: Target date: 04/27/2021   Patient will be independent with initial home exercise program for self-management of symptoms. Baseline: Initial HEP to be provided at visit 2 as appropriate (04/13/21); initial HEP provided at visit 2 (05/03/2021);  Goal status: In-progress     LONG TERM GOALS: Target date: 07/06/2021   Patient will be independent with a long-term home exercise program for self-management of symptoms.  Baseline: Initial HEP to be provided at visit 2 as appropriate (04/13/21); initial HEP provided at visit 2 (05/03/2021);  Goal status: In-Progress   2.  Patient will demonstrate improved FOTO by equal or greater than 10 points to demonstrate improvement in overall condition and self-reported functional ability.  Baseline: 60 at visit # 1 (04/13/21); 69 at visit #5 (05/20/2021);  Goal status: In-progress   3.  Patient will demonstrate left hip  strength equal or greater than right hip strength with no increase in symptoms to improve his ability to complete athletic activities such as hiking and biking with less difficulty.  Baseline: L hip 1/2-2 grades lower than R in some motions - see objective exam  (04/13/21); Goal status: In-progress   4.  Reduce pain with functional activities to equal or less than 2/10 to allow patient to complete usual activities including housework, walking, working, biking, and outdoor athletic activities less difficulty.   Baseline: up to 8/10 (04/13/21); Goal status: In-progress   5.  Patient will complete community, work and/or recreational activities without limitation due to current condition.  Baseline: walking, working, cycling, skiing, anything that involves walking, yardwork, hiking, biking, walking the dog, bird watching (04/13/21); Goal status: In-progress         PLAN: PT FREQUENCY: 1-2x/week   PT DURATION: 12 weeks   PLANNED INTERVENTIONS: Therapeutic exercises, Therapeutic activity, Neuromuscular re-education, Balance  training, Patient/Family education, Joint mobilization, Dry Needling, Electrical stimulation, Spinal manipulation, Spinal mobilization, Cryotherapy, Moist heat, and Manual therapy.   PLAN FOR NEXT SESSION: update HEP as appropriate, progressive LE and functional strength, manual therapy and stretching as appropriate.    Everlean Simon. Graylon Good, PT, DPT 05/20/21, 7:47 PM  [facility] [address] [place], [place] [address] P: [phone] I F: [phone]

## 2021-05-24 ENCOUNTER — Encounter: Payer: Self-pay | Admitting: Physical Therapy

## 2021-05-24 ENCOUNTER — Ambulatory Visit: Payer: BC Managed Care – PPO | Admitting: Physical Therapy

## 2021-05-24 DIAGNOSIS — M5459 Other low back pain: Secondary | ICD-10-CM | POA: Diagnosis not present

## 2021-05-24 DIAGNOSIS — M25552 Pain in left hip: Secondary | ICD-10-CM | POA: Diagnosis not present

## 2021-05-24 DIAGNOSIS — M5432 Sciatica, left side: Secondary | ICD-10-CM | POA: Diagnosis not present

## 2021-05-24 NOTE — Therapy (Signed)
OUTPATIENT PHYSICAL THERAPY TREATMENT NOTE   Patient Name: Benjamin Simon MRN: [id number] DOB:04/07/1960, 61 y.o., male Today's Date: 05/24/2021  PCP: Elveria Rising. Damita Dunnings, MD REFERRING PROVIDER: Michela Pitcher, NP  END OF SESSION:   PT End of Session - 05/24/21 1735     Visit Number 6    Number of Visits 24    Date for PT Re-Evaluation 07/06/21    Authorization Type [employer] reporting period from 04/13/2021    Authorization Time Period 30 PT/OT/Chiro per cal yr    Authorization - Visit Number 6    Authorization - Number of Visits 30    Progress Note Due on Visit 10    PT Start Time 6720    PT Stop Time 1733    PT Time Calculation (min) 43 min    Activity Tolerance Patient tolerated treatment well    Behavior During Therapy WFL for tasks assessed/performed                 Past Medical History:  Diagnosis Date   History of squamous cell carcinoma in situ    Hx of dysplastic nevus 10/05/2016   R medial to inf scapula upper back, mild atypia   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   L anterior deltoid, SCC IS   Past Surgical History:  Procedure Laterality Date   CYST EXCISION     x2   FINGER TENDON REPAIR     R thumb tendon repair   Fracture of right fibula with fractured ankle     Laceration RLE     TONSILLECTOMY     VASECTOMY  02/24/2006   [facility]   Patient Active Problem List   Diagnosis Date Noted   Left hip pain 03/23/2021   Acute left-sided back pain with sciatica 03/23/2021   Shoulder pain 10/28/2016   Advance care planning 08/06/2014   Routine general medical examination at a health care facility 09/02/2011    REFERRING DIAG: left hip pain, acute left-sided back pain with sciatica  THERAPY DIAG:  Pain in left hip  Other low back pain  Sciatica, left side  Rationale for Evaluation and Treatment: Rehabilitation  PERTINENT HISTORY: Patient is a 61 y.o. male who presents to outpatient physical therapy with a referral for  medical diagnosis left hip pain, acute left-sided back pain with sciatica. This patient's chief complaints consist of left hip pain and low back pain with paresthesia radiating down left LE to dorsal lateral aspect of foot leading to the following functional deficits: difficulty walking, working, cycling, skiing, anything that involves walking, yardwork, hiking, biking, walking the dog, bird watching. Relevant past medical history and comorbidities include skin carcinoma, right ankle fracture in 1970s (limited ROM), has numbness in R thigh after laceration there.  Patient denies hx of stroke, seizures, lung problems, heart problems, diabetes, unexplained weight loss, unexplained changes in bowel or bladder problems, unexplained stumbling or dropping things, osteoporosis, and spinal surgery  PRECAUTIONS: none  SUBJECTIVE: Patient reports he is feeling pretty good. He had a good bike ride yesterday. He states his back has not been bothering him as much but the hip has been "a little tweaky." He feels the back first when riding initially. He doesn't notice his left hip as much unless he moves it out of range but his left hip stiffens up after he sits for a while, but this happens any time he sits for a while, not just when riding.   PAIN:  Are you  having pain? No  OBJECTIVE   TODAY'S TREATMENT:  Therapeutic exercise: to centralize symptoms and improve ROM, strength, muscular endurance, and activity tolerance required for successful completion of functional activities.  - Recumbent Bike level 1. For improved lower extremity ROM, muscular endurance, and activity tolerance; and to induce the analgesic effect of aerobic exercise, stimulate joint nutrition, and prepare body structures and systems for following interventions. x 6  minutes. (Manual therapy/dry needling - see below) - prone alternating hip extension with abdominal brace and knee extended (multifidus kick), 2x10 each side.  - standing running man  with touchdown UE support, 2x10 each side.   Manual therapy: to reduce pain and tissue tension, improve range of motion, neuromodulation, in order to promote improved ability to complete functional activities. SUPINE L hip joint mobilizations grades III-IV:  - long axis distraction 3x30 seconds - posterolateral glide, 2x30 seconds - caudal glide, 2x30 seconds  PRONE - STM to bilateral lumbar paraspinals L > R.   Modality: (unbilled) Dry needling performed to low back to decrease pain and spasms along patient's low back, left hip, and left leg region with patient in prone utilizing 2 dry needle(s) .34m x 637mwith 2 sticks at left and lumbar multifidi at approx L4, and L5 and right lumbar multifidi. Patient educated about the risks and benefits from therapy and verbally consents to treatment.  Dry needling performed by SaEverlean AlstromSnGraylon GoodT, DPT who is certified in this technique.   PATIENT EDUCATION: Education details: Education on diagnosis, prognosis, POC, anatomy and physiology of current condition.  Person educated: Patient Education method: Explanation Education comprehension: verbalized understanding and needs further education     HOME EXERCISE PROGRAM: Access Code: [email or web address] URL: [email or web address] Date: 05/20/2021 Prepared by: SaRosita KeaExercises - Single-Leg Anti-Rotation Press With Anchored Resistance  - 3-5 x weekly - 2-33 sets - 10 reps - Forward Monster Walks  - 1 x daily - 3-5 x weekly - 3 sets - 20 feet - Backward Monster Walks  - 1 x daily - 3-5 x weekly - 3 sets - 20 feet - Half Kneeling Hip Flexor Stretch  - 1 x daily - 1-2 sets - 10 reps - 5 seconds hold - Marching Bridge  - 3-5 x weekly - 3 sets - 10 reps     ASSESSMENT:   CLINICAL IMPRESSION: Patient continues to report decreased pain and difficulty with functional activities. Patient continues to have concordant back pain at left lower lumbar region but without referral to the left LE  during manual and dry needling today. Plan to continue with lumbopelvic and hip strengthening with manual therapy and dry needling to decrease pain and tension as appropriate next visit. Patient would benefit from continued management of limiting condition by skilled physical therapist to address remaining impairments and functional limitations to work towards stated goals and return to PLOF or maximal functional independence.    Patient is a [age].o. male referred to outpatient physical therapy with a medical diagnosis of left hip pain, acute left-sided back pain with sciatica who presents with signs and symptoms consistent with acute on chronic left hip pain that appears to be contributing most strongly to current symptoms and acute on chronic low back pain with intermittent radiation to L LE to dorsal aspect of foot. Patient presents with significant pain, paresthesia, ROM, joint stiffness, muscle tension, muscle performance (strength/power/endurance) and activity tolerance impairments that are limiting ability to complete his usual activities including walking, working, cycling, skiing, anything that involves  walking, yardwork, hiking, biking, walking the dog, bird watching without difficulty and decreases his quality of life. Patient will benefit from skilled physical therapy intervention to address current body structure impairments and activity limitations to improve function and work towards goals set in current POC in order to return to prior level of function or maximal functional improvement.        OBJECTIVE IMPAIRMENTS decreased activity tolerance, decreased mobility, difficulty walking, decreased ROM, decreased strength, hypomobility, impaired perceived functional ability, impaired flexibility, and pain.    ACTIVITY LIMITATIONS community activity, occupation, yard work, and walking, working, cycling, skiing, anything that involves walking, yardwork, hiking, biking, walking the dog, bird  watching.    PERSONAL FACTORS Past/current experiences, Time since onset of injury/illness/exacerbation, and 3+ comorbidities:  skin carcinoma, right ankle fracture in 1970s (limited ROM), has numbness in R thigh after laceration there are also affecting patient's functional outcome.      REHAB POTENTIAL: Good   CLINICAL DECISION MAKING: Stable/uncomplicated   EVALUATION COMPLEXITY: Low     GOALS: Goals reviewed with patient? No   SHORT TERM GOALS: Target date: 04/27/2021   Patient will be independent with initial home exercise program for self-management of symptoms. Baseline: Initial HEP to be provided at visit 2 as appropriate (04/13/21); initial HEP provided at visit 2 (05/03/2021);  Goal status: In-progress     LONG TERM GOALS: Target date: 07/06/2021   Patient will be independent with a long-term home exercise program for self-management of symptoms.  Baseline: Initial HEP to be provided at visit 2 as appropriate (04/13/21); initial HEP provided at visit 2 (05/03/2021);  Goal status: In-Progress   2.  Patient will demonstrate improved FOTO by equal or greater than 10 points to demonstrate improvement in overall condition and self-reported functional ability.  Baseline: 60 at visit # 1 (04/13/21); 69 at visit #5 (05/20/2021);  Goal status: In-progress   3.  Patient will demonstrate left hip strength equal or greater than right hip strength with no increase in symptoms to improve his ability to complete athletic activities such as hiking and biking with less difficulty.  Baseline: L hip 1/2-2 grades lower than R in some motions - see objective exam  (04/13/21); Goal status: In-progress   4.  Reduce pain with functional activities to equal or less than 2/10 to allow patient to complete usual activities including housework, walking, working, biking, and outdoor athletic activities less difficulty.   Baseline: up to 8/10 (04/13/21); Goal status: In-progress   5.  Patient will  complete community, work and/or recreational activities without limitation due to current condition.  Baseline: walking, working, cycling, skiing, anything that involves walking, yardwork, hiking, biking, walking the dog, bird watching (04/13/21); Goal status: In-progress         PLAN: PT FREQUENCY: 1-2x/week   PT DURATION: 12 weeks   PLANNED INTERVENTIONS: Therapeutic exercises, Therapeutic activity, Neuromuscular re-education, Balance training, Patient/Family education, Joint mobilization, Dry Needling, Electrical stimulation, Spinal manipulation, Spinal mobilization, Cryotherapy, Moist heat, and Manual therapy.   PLAN FOR NEXT SESSION: update HEP as appropriate, progressive LE and functional strength, manual therapy and stretching as appropriate.    Everlean Alstrom. Graylon Good, PT, DPT 05/24/21, 5:36 PM  [facility] [address] [place], [place] [address] P: [phone] I F: [phone]

## 2021-05-26 NOTE — Therapy (Signed)
OUTPATIENT PHYSICAL THERAPY TREATMENT NOTE   Patient Name: Benjamin Simon MRN: [id number] DOB:01/02/1961, 61 y.o., male Today's Date: 05/27/2021  PCP: Elveria Rising. Damita Dunnings, MD REFERRING PROVIDER: Michela Pitcher, NP  END OF SESSION:   PT End of Session - 05/27/21 1913     Visit Number 7    Number of Visits 24    Date for PT Re-Evaluation 07/06/21    Authorization Type [employer] reporting period from 04/13/2021    Authorization Time Period 30 PT/OT/Chiro per cal yr    Authorization - Visit Number 7    Authorization - Number of Visits 30    Progress Note Due on Visit 10    PT Start Time 1822    PT Stop Time 1900    PT Time Calculation (min) 38 min    Activity Tolerance Patient tolerated treatment well    Behavior During Therapy WFL for tasks assessed/performed                  Past Medical History:  Diagnosis Date   History of squamous cell carcinoma in situ    Hx of dysplastic nevus 10/05/2016   R medial to inf scapula upper back, mild atypia   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   L anterior deltoid, SCC IS   Past Surgical History:  Procedure Laterality Date   CYST EXCISION     x2   FINGER TENDON REPAIR     R thumb tendon repair   Fracture of right fibula with fractured ankle     Laceration RLE     TONSILLECTOMY     VASECTOMY  02/24/2006   [facility]   Patient Active Problem List   Diagnosis Date Noted   Left hip pain 03/23/2021   Acute left-sided back pain with sciatica 03/23/2021   Shoulder pain 10/28/2016   Advance care planning 08/06/2014   Routine general medical examination at a health care facility 09/02/2011    REFERRING DIAG: left hip pain, acute left-sided back pain with sciatica  THERAPY DIAG:  Pain in left hip  Other low back pain  Sciatica, left side  Rationale for Evaluation and Treatment: Rehabilitation  PERTINENT HISTORY: Patient is a 61 y.o. male who presents to outpatient physical therapy with a referral for  medical diagnosis left hip pain, acute left-sided back pain with sciatica. This patient's chief complaints consist of left hip pain and low back pain with paresthesia radiating down left LE to dorsal lateral aspect of foot leading to the following functional deficits: difficulty walking, working, cycling, skiing, anything that involves walking, yardwork, hiking, biking, walking the dog, bird watching. Relevant past medical history and comorbidities include skin carcinoma, right ankle fracture in 1970s (limited ROM), has numbness in R thigh after laceration there.  Patient denies hx of stroke, seizures, lung problems, heart problems, diabetes, unexplained weight loss, unexplained changes in bowel or bladder problems, unexplained stumbling or dropping things, osteoporosis, and spinal surgery  PRECAUTIONS: none  SUBJECTIVE: Patient reports he has no pain upon arrival. He states things are feeling about the same. He rode his bike yesterday and felt a little dehydrated making him a little sore everywhere. 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   PAIN:  Are you having pain? No  OBJECTIVE   TODAY'S TREATMENT:  Therapeutic exercise: to centralize symptoms and improve ROM, strength, muscular endurance, and activity tolerance required for successful completion of functional activities.  -  Recumbent Bike level 1. For improved lower extremity ROM, muscular endurance, and activity tolerance; and to induce the analgesic effect of aerobic exercise, stimulate joint nutrition, and prepare body structures and systems for following interventions. x 6  minutes. (Manual therapy - see below) - standing glute pull through with 20# cable, 3x10  Manual therapy: to reduce pain and tissue tension, improve range of motion, neuromodulation, in order to promote improved ability to complete functional activities. PRONE - STM to bilateral lumbar paraspinals L > R.  - L hip joint AP  mobilization, grade III-IV, with bolster under distal thigh, 3x30 seconds in neutral and 3x30 seconds with slight ER  SUPINE L hip joint mobilizations grades III-IV:  - long axis distraction 3x30 seconds - posterolateral glide, 3x30 seconds - caudal glide, 3x30 seconds    PATIENT EDUCATION: Education details: Exercise purpose/form. Self management techniques. Person educated: Patient Education method: Explanation, demonstration, visual, verbal cuing Education comprehension: verbalized understanding and needs further education     HOME EXERCISE PROGRAM: Access Code: [email or web address] URL: [email or web address] Date: 05/20/2021 Prepared by: Rosita Kea  Exercises - Single-Leg Anti-Rotation Press With Anchored Resistance  - 3-5 x weekly - 2-33 sets - 10 reps - Forward Monster Walks  - 1 x daily - 3-5 x weekly - 3 sets - 20 feet - Backward Monster Walks  - 1 x daily - 3-5 x weekly - 3 sets - 20 feet - Half Kneeling Hip Flexor Stretch  - 1 x daily - 1-2 sets - 10 reps - 5 seconds hold - Marching Bridge  - 3-5 x weekly - 3 sets - 10 reps     ASSESSMENT:   CLINICAL IMPRESSION: Patient tolerated treatment well with no increase in pain by end of session. He was able to do glute pull through without cramping in the hamstrings. He did get the feeling his left hip was going to catch after PA joint mobilizations and long axis distraction, but this resolved after caudal and posterolateral glides. Plan to continue with interventions for pain relief and improved hip and core strength. Patient would benefit from continued management of limiting condition by skilled physical therapist to address remaining impairments and functional limitations to work towards stated goals and return to PLOF or maximal functional independence.   Patient is a 61 y.o. male referred to outpatient physical therapy with a medical diagnosis of left hip pain, acute left-sided back pain with sciatica who presents with  signs and symptoms consistent with acute on chronic left hip pain that appears to be contributing most strongly to current symptoms and acute on chronic low back pain with intermittent radiation to L LE to dorsal aspect of foot. Patient presents with significant pain, paresthesia, ROM, joint stiffness, muscle tension, muscle performance (strength/power/endurance) and activity tolerance impairments that are limiting ability to complete his usual activities including walking, working, cycling, skiing, anything that involves walking, yardwork, hiking, biking, walking the dog, bird watching without difficulty and decreases his quality of life. Patient will benefit from skilled physical therapy intervention to address current body structure impairments and activity limitations to improve function and work towards goals set in current POC in order to return to prior level of function or maximal functional improvement.        OBJECTIVE IMPAIRMENTS decreased activity tolerance, decreased mobility, difficulty walking, decreased ROM, decreased strength, hypomobility, impaired perceived functional ability, impaired flexibility, and pain.    ACTIVITY LIMITATIONS community activity, occupation, yard work, and walking, working, cycling, skiing, anything that involves walking, [place],  hiking, biking, walking the dog, bird watching.    PERSONAL FACTORS Past/current experiences, Time since onset of injury/illness/exacerbation, and 3+ comorbidities:  skin carcinoma, right ankle fracture in 1970s (limited ROM), has numbness in R thigh after laceration there are also affecting patient's functional outcome.      REHAB POTENTIAL: Good   CLINICAL DECISION MAKING: Stable/uncomplicated   EVALUATION COMPLEXITY: Low     GOALS: Goals reviewed with patient? No   SHORT TERM GOALS: Target date: 04/27/2021   Patient will be independent with initial home exercise program for self-management of symptoms. Baseline: Initial  HEP to be provided at visit 2 as appropriate (04/13/21); initial HEP provided at visit 2 (05/03/2021);  Goal status: In-progress     LONG TERM GOALS: Target date: 07/06/2021   Patient will be independent with a long-term home exercise program for self-management of symptoms.  Baseline: Initial HEP to be provided at visit 2 as appropriate (04/13/21); initial HEP provided at visit 2 (05/03/2021);  Goal status: In-Progress   2.  Patient will demonstrate improved FOTO by equal or greater than 10 points to demonstrate improvement in overall condition and self-reported functional ability.  Baseline: 60 at visit # 1 (04/13/21); 69 at visit #5 (05/20/2021);  Goal status: In-progress   3.  Patient will demonstrate left hip strength equal or greater than right hip strength with no increase in symptoms to improve his ability to complete athletic activities such as hiking and biking with less difficulty.  Baseline: L hip 1/2-2 grades lower than R in some motions - see objective exam  (04/13/21); Goal status: In-progress   4.  Reduce pain with functional activities to equal or less than 2/10 to allow patient to complete usual activities including housework, walking, working, biking, and outdoor athletic activities less difficulty.   Baseline: up to 8/10 (04/13/21); Goal status: In-progress   5.  Patient will complete community, work and/or recreational activities without limitation due to current condition.  Baseline: walking, working, cycling, skiing, anything that involves walking, yardwork, hiking, biking, walking the dog, bird watching (04/13/21); Goal status: In-progress         PLAN: PT FREQUENCY: 1-2x/week   PT DURATION: 12 weeks   PLANNED INTERVENTIONS: Therapeutic exercises, Therapeutic activity, Neuromuscular re-education, Balance training, Patient/Family education, Joint mobilization, Dry Needling, Electrical stimulation, Spinal manipulation, Spinal mobilization, Cryotherapy, Moist heat, and  Manual therapy.   PLAN FOR NEXT SESSION: update HEP as appropriate, progressive LE and functional strength, manual therapy and stretching as appropriate.    Everlean Alstrom. Graylon Good, PT, DPT 05/27/21, 7:17 PM  [facility] [address] [place], [place] [address] P: [phone] I F: [phone]

## 2021-05-27 ENCOUNTER — Encounter: Payer: Self-pay | Admitting: Physical Therapy

## 2021-05-27 ENCOUNTER — Ambulatory Visit: Payer: BC Managed Care – PPO | Admitting: Physical Therapy

## 2021-05-27 DIAGNOSIS — M5459 Other low back pain: Secondary | ICD-10-CM | POA: Diagnosis not present

## 2021-05-27 DIAGNOSIS — M5432 Sciatica, left side: Secondary | ICD-10-CM

## 2021-05-27 DIAGNOSIS — M25552 Pain in left hip: Secondary | ICD-10-CM

## 2021-05-27 NOTE — Therapy (Addendum)
OUTPATIENT PHYSICAL THERAPY TREATMENT NOTE   Patient Name: Benjamin Simon MRN: [id number] DOB:[birth year], 61 y.o., male Today's Date: 06/01/2021  PCP: Elveria Rising. Damita Dunnings, MD REFERRING PROVIDER: Michela Pitcher, NP  END OF SESSION:   PT End of Session - 06/01/21 1651     Visit Number 8    Number of Visits 24    Date for PT Re-Evaluation 07/06/21    Authorization Type [employer] reporting period from 04/13/2021    Authorization Time Period 30 PT/OT/Chiro per cal yr    Authorization - Visit Number 8    Authorization - Number of Visits 30    Progress Note Due on Visit 10    PT Start Time 1648    PT Stop Time 1726    PT Time Calculation (min) 38 min    Activity Tolerance Patient tolerated treatment well    Behavior During Therapy WFL for tasks assessed/performed              Past Medical History:  Diagnosis Date   History of squamous cell carcinoma in situ    Hx of dysplastic nevus 10/05/2016   R medial to inf scapula upper back, mild atypia   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   L anterior deltoid, SCC IS   Past Surgical History:  Procedure Laterality Date   CYST EXCISION     x2   FINGER TENDON REPAIR     R thumb tendon repair   Fracture of right fibula with fractured ankle     Laceration RLE     TONSILLECTOMY     VASECTOMY  02/24/2006   [facility]   Patient Active Problem List   Diagnosis Date Noted   Left hip pain 03/23/2021   Acute left-sided back pain with sciatica 03/23/2021   Shoulder pain 10/28/2016   Advance care planning 08/06/2014   Routine general medical examination at a health care facility 09/02/2011    REFERRING DIAG: left hip pain, acute left-sided back pain with sciatica  THERAPY DIAG:  Pain in left hip  Other low back pain  Sciatica, left side  Rationale for Evaluation and Treatment: Rehabilitation  PERTINENT HISTORY: Patient is a 61 y.o. male who presents to outpatient physical therapy with a referral for medical  diagnosis left hip pain, acute left-sided back pain with sciatica. This patient's chief complaints consist of left hip pain and low back pain with paresthesia radiating down left LE to dorsal lateral aspect of foot leading to the following functional deficits: difficulty walking, working, cycling, skiing, anything that involves walking, yardwork, hiking, biking, walking the dog, bird watching. Relevant past medical history and comorbidities include skin carcinoma, right ankle fracture in 1970s (limited ROM), has numbness in R thigh after laceration there.  Patient denies hx of stroke, seizures, lung problems, heart problems, diabetes, unexplained weight loss, unexplained changes in bowel or bladder problems, unexplained stumbling or dropping things, osteoporosis, and spinal surgery  PRECAUTIONS: none  SUBJECTIVE: Patient reports he is feeling well today. He states he went to the chiropractor who helped him with some pinched nerves in his upper back that were aggravated after doing yard work this weekend at his [employer]. He is hoping to ride his bike later tonight. He states his hip has been feeling about the same. He reports no problems with his hamstrings after last PT session. He was not sore after last PT session. Patient reports he feels he has returned to PLOF before his most recent flair up  that brought him to PT and is ready to discharge from PT today.   PAIN:  Are you having pain? No  OBJECTIVE  SELF-REPORTED FUNCTION FOTO score: 69/100 (hip questionnaire)   TODAY'S TREATMENT:  Therapeutic exercise: to centralize symptoms and improve ROM, strength, muscular endurance, and activity tolerance required for successful completion of functional activities.  - Recumbent Bike level 1. For improved lower extremity ROM, muscular endurance, and activity tolerance; and to induce the analgesic effect of aerobic exercise, stimulate joint nutrition, and prepare body structures and systems for following  interventions. x 6  minutes. (Manual therapy - see below) - standing glute pull through with 20# cable, 3x10  Manual therapy: to reduce pain and tissue tension, improve range of motion, neuromodulation, in order to promote improved ability to complete functional activities. PRONE - STM to bilateral lumbar paraspinals L > R.  - L hip joint AP mobilization, grade III-IV, with bolster under distal thigh, 3x30 seconds in neutral and 3x30 seconds with slight ER  SUPINE L hip joint mobilizations grades III-IV:  - long axis distraction 3x30 seconds - posterolateral glide, 3x30 seconds - caudal glide, 3x30 seconds    PATIENT EDUCATION: Education details: Exercise purpose/form. Self management techniques. Person educated: Patient Education method: Explanation, demonstration, visual, verbal cuing Education comprehension: verbalized understanding and needs further education     HOME EXERCISE PROGRAM: Access Code: [email or web address] URL: [email or web address] Date: 05/20/2021 Prepared by: Rosita Kea  Exercises - Single-Leg Anti-Rotation Press With Anchored Resistance  - 3-5 x weekly - 2-33 sets - 10 reps - Forward Monster Walks  - 1 x daily - 3-5 x weekly - 3 sets - 20 feet - Backward Monster Walks  - 1 x daily - 3-5 x weekly - 3 sets - 20 feet - Half Kneeling Hip Flexor Stretch  - 1 x daily - 1-2 sets - 10 reps - 5 seconds hold - Marching Bridge  - 3-5 x weekly - 3 sets - 10 reps     ASSESSMENT:   CLINICAL IMPRESSION: Patient has attended 8 physical therapy sessions since starting current episode of care on 04/13/2021. He has met or nearly met all of his goals and is being discharged from PT today to long term HEP due to his improvement and readiness to manage symptoms independently.      OBJECTIVE IMPAIRMENTS decreased activity tolerance, decreased mobility, difficulty walking, decreased ROM, decreased strength, hypomobility, impaired perceived functional ability, impaired  flexibility, and pain.    ACTIVITY LIMITATIONS community activity, occupation, yard work, and walking, working, cycling, skiing, anything that involves walking, yardwork, hiking, biking, walking the dog, bird watching.    PERSONAL FACTORS Past/current experiences, Time since onset of injury/illness/exacerbation, and 3+ comorbidities:  skin carcinoma, right ankle fracture in 1970s (limited ROM), has numbness in R thigh after laceration there are also affecting patient's functional outcome.      REHAB POTENTIAL: Good   CLINICAL DECISION MAKING: Stable/uncomplicated   EVALUATION COMPLEXITY: Low     GOALS: Goals reviewed with patient? No   SHORT TERM GOALS: Target date: 04/27/2021   Patient will be independent with initial home exercise program for self-management of symptoms. Baseline: Initial HEP to be provided at visit 2 as appropriate (04/13/21); initial HEP provided at visit 2 (05/03/2021);  Goal status: Achieved     LONG TERM GOALS: Target date: 07/06/2021   Patient will be independent with a long-term home exercise program for self-management of symptoms.  Baseline: Initial HEP to be provided at visit 2 as appropriate (  04/13/21); initial HEP provided at visit 2 (05/03/2021);  Goal status: Achieved   2.  Patient will demonstrate improved FOTO by equal or greater than 10 points to demonstrate improvement in overall condition and self-reported functional ability.  Baseline: 60 at visit # 1 (04/13/21); 69 at visit #5 (05/20/2021); 69 at visit # 8 (06/01/2021);  Goal status: Nearly met   3.  Patient will demonstrate left hip strength equal or greater than right hip strength with no increase in symptoms to improve his ability to complete athletic activities such as hiking and biking with less difficulty.  Baseline: L hip 1/2-2 grades lower than R in some motions - see objective exam  (04/13/21); Goal status: not measured   4.  Reduce pain with functional activities to equal or less than  2/10 to allow patient to complete usual activities including housework, walking, working, biking, and outdoor athletic activities less difficulty.   Baseline: up to 8/10 (04/13/21); Goal status: met to patient's satisfaction   5.  Patient will complete community, work and/or recreational activities without limitation due to current condition.  Baseline: walking, working, cycling, skiing, anything that involves walking, yardwork, hiking, biking, walking the dog, bird watching (04/13/21); Goal status: Met to patient's satisfaction         PLAN: PT FREQUENCY: 1-2x/week   PT DURATION: 12 weeks   PLANNED INTERVENTIONS: Therapeutic exercises, Therapeutic activity, Neuromuscular re-education, Balance training, Patient/Family education, Joint mobilization, Dry Needling, Electrical stimulation, Spinal manipulation, Spinal mobilization, Cryotherapy, Moist heat, and Manual therapy.   PLAN FOR NEXT SESSION: Patient is now discharged from [place]. Graylon Good, PT, DPT 06/01/21, 5:38 PM  [facility] [address] [place], [place] [address] P: [phone] I F: [phone]

## 2021-06-01 ENCOUNTER — Ambulatory Visit: Payer: BC Managed Care – PPO | Admitting: Physical Therapy

## 2021-06-01 ENCOUNTER — Encounter: Payer: Self-pay | Admitting: Physical Therapy

## 2021-06-01 DIAGNOSIS — M5432 Sciatica, left side: Secondary | ICD-10-CM | POA: Diagnosis not present

## 2021-06-01 DIAGNOSIS — M9901 Segmental and somatic dysfunction of cervical region: Secondary | ICD-10-CM | POA: Diagnosis not present

## 2021-06-01 DIAGNOSIS — M25552 Pain in left hip: Secondary | ICD-10-CM

## 2021-06-01 DIAGNOSIS — M5033 Other cervical disc degeneration, cervicothoracic region: Secondary | ICD-10-CM | POA: Diagnosis not present

## 2021-06-01 DIAGNOSIS — M5416 Radiculopathy, lumbar region: Secondary | ICD-10-CM | POA: Diagnosis not present

## 2021-06-01 DIAGNOSIS — M5459 Other low back pain: Secondary | ICD-10-CM

## 2021-06-01 DIAGNOSIS — M9903 Segmental and somatic dysfunction of lumbar region: Secondary | ICD-10-CM | POA: Diagnosis not present

## 2021-06-03 ENCOUNTER — Encounter: Payer: BC Managed Care – PPO | Admitting: Physical Therapy

## 2021-06-08 DIAGNOSIS — M9901 Segmental and somatic dysfunction of cervical region: Secondary | ICD-10-CM | POA: Diagnosis not present

## 2021-06-08 DIAGNOSIS — M5033 Other cervical disc degeneration, cervicothoracic region: Secondary | ICD-10-CM | POA: Diagnosis not present

## 2021-06-08 DIAGNOSIS — M9903 Segmental and somatic dysfunction of lumbar region: Secondary | ICD-10-CM | POA: Diagnosis not present

## 2021-06-08 DIAGNOSIS — M5416 Radiculopathy, lumbar region: Secondary | ICD-10-CM | POA: Diagnosis not present

## 2021-06-15 DIAGNOSIS — M9903 Segmental and somatic dysfunction of lumbar region: Secondary | ICD-10-CM | POA: Diagnosis not present

## 2021-06-15 DIAGNOSIS — M5033 Other cervical disc degeneration, cervicothoracic region: Secondary | ICD-10-CM | POA: Diagnosis not present

## 2021-06-15 DIAGNOSIS — M5416 Radiculopathy, lumbar region: Secondary | ICD-10-CM | POA: Diagnosis not present

## 2021-06-15 DIAGNOSIS — M9901 Segmental and somatic dysfunction of cervical region: Secondary | ICD-10-CM | POA: Diagnosis not present

## 2021-06-29 DIAGNOSIS — M9902 Segmental and somatic dysfunction of thoracic region: Secondary | ICD-10-CM | POA: Diagnosis not present

## 2021-06-29 DIAGNOSIS — M542 Cervicalgia: Secondary | ICD-10-CM | POA: Diagnosis not present

## 2021-06-29 DIAGNOSIS — M9901 Segmental and somatic dysfunction of cervical region: Secondary | ICD-10-CM | POA: Diagnosis not present

## 2021-06-29 DIAGNOSIS — M5033 Other cervical disc degeneration, cervicothoracic region: Secondary | ICD-10-CM | POA: Diagnosis not present

## 2021-07-25 ENCOUNTER — Other Ambulatory Visit: Payer: Self-pay | Admitting: Family Medicine

## 2021-07-25 DIAGNOSIS — Z13220 Encounter for screening for lipoid disorders: Secondary | ICD-10-CM

## 2021-07-27 DIAGNOSIS — M9901 Segmental and somatic dysfunction of cervical region: Secondary | ICD-10-CM | POA: Diagnosis not present

## 2021-07-27 DIAGNOSIS — M542 Cervicalgia: Secondary | ICD-10-CM | POA: Diagnosis not present

## 2021-07-27 DIAGNOSIS — M9902 Segmental and somatic dysfunction of thoracic region: Secondary | ICD-10-CM | POA: Diagnosis not present

## 2021-07-27 DIAGNOSIS — M5033 Other cervical disc degeneration, cervicothoracic region: Secondary | ICD-10-CM | POA: Diagnosis not present

## 2021-08-05 ENCOUNTER — Other Ambulatory Visit (INDEPENDENT_AMBULATORY_CARE_PROVIDER_SITE_OTHER): Payer: BC Managed Care – PPO

## 2021-08-05 DIAGNOSIS — Z13220 Encounter for screening for lipoid disorders: Secondary | ICD-10-CM

## 2021-08-05 LAB — LIPID PANEL
Cholesterol: 179 mg/dL (ref 0–200)
HDL: 51 mg/dL (ref >39.00)
LDL Cholesterol: 109 mg/dL — ABNORMAL HIGH (ref 0–99)
NonHDL: 127.67
Total CHOL/HDL Ratio: 4
Triglycerides: 91 mg/dL (ref 0.0–149.0)
VLDL: 18.2 mg/dL (ref 0.0–40.0)

## 2021-08-12 ENCOUNTER — Encounter: Payer: 59 | Admitting: Family Medicine

## 2021-08-16 ENCOUNTER — Encounter: Payer: Self-pay | Admitting: Family Medicine

## 2021-08-16 ENCOUNTER — Ambulatory Visit (INDEPENDENT_AMBULATORY_CARE_PROVIDER_SITE_OTHER): Payer: BC Managed Care – PPO | Admitting: Family Medicine

## 2021-08-16 VITALS — BP 118/80 | HR 60 | Temp 97.9°F | Ht 67.0 in | Wt 165.0 lb

## 2021-08-16 DIAGNOSIS — Z0000 Encounter for general adult medical examination without abnormal findings: Secondary | ICD-10-CM | POA: Diagnosis not present

## 2021-08-16 DIAGNOSIS — M25552 Pain in left hip: Secondary | ICD-10-CM

## 2021-08-16 DIAGNOSIS — Z7189 Other specified counseling: Secondary | ICD-10-CM

## 2021-08-16 NOTE — Progress Notes (Unsigned)
CPE- See plan.  Routine anticipatory guidance given to patient.  See health maintenance.  The possibility exists that previously documented standard health maintenance information may have been brought forward from a previous encounter into this note.  If needed, that same information has been updated to reflect the current situation based on today's encounter.    D/w pt about cessation of tobacco.  Options d/w pt.    Tetanus 2013  PNA and shingles d/w pt.   Flu encouraged  Covid vaccine d/w pt.   Prostate cancer screening and PSA options (with potential risks and benefits of testing vs not testing) were discussed along with recent recs/guidelines. He declined testing PSA at this point.  Colonoscopy 2016 Advance directive encouarged. Wife would be designated if he were incapacitated.  [facility]    We talked about his prev imaging of his hips.  He improved with PT.  He'll update me as needed.  He is still cycling avidly.    PMH and SH reviewed  Meds, vitals, and allergies reviewed.   ROS: Per HPI.  Unless specifically indicated otherwise in HPI, the patient denies:  General: fever. Eyes: acute vision changes ENT: sore throat Cardiovascular: chest pain Respiratory: SOB GI: vomiting GU: dysuria Musculoskeletal: acute back pain Derm: acute rash Neuro: acute motor dysfunction Psych: worsening mood Endocrine: polydipsia Heme: bleeding Allergy: hayfever  GEN: nad, alert and oriented HEENT: ncat NECK: supple w/o LA CV: rrr. PULM: ctab, no inc wob ABD: soft, +bs EXT: no edema SKIN: no acute rash  The 10-year ASCVD risk score (Arnett DK, et al., 2019) is: 6.1%   Values used to calculate the score:     Age: 61 years     Sex: Male     Is Non-Hispanic African American: No     Diabetic: No     Tobacco smoker: No     Systolic Blood Pressure: 361 mmHg     Is BP treated: No     HDL Cholesterol: 51 mg/dL     Total Cholesterol: 179 mg/dL  Discussed that  his ASCVD may overstate his risk given his level of fitness and history of cardiovascular exercise.

## 2021-08-16 NOTE — Patient Instructions (Signed)
Thanks for your effort.  Take care.  Glad to see you. Update me as needed.   I would get a flu shot each fall.   Check with your insurance to see if they will cover the shingles shot.

## 2021-08-18 NOTE — Assessment & Plan Note (Signed)
D/w pt about cessation of tobacco.  Options d/w pt.    Tetanus 2013  PNA and shingles d/w pt.   Flu encouraged  Covid vaccine d/w pt.   Prostate cancer screening and PSA options (with potential risks and benefits of testing vs not testing) were discussed along with recent recs/guidelines. He declined testing PSA at this point.  Colonoscopy 2016 Advance directive encouarged. Wife would be designated if he were incapacitated.  [facility]

## 2021-08-18 NOTE — Assessment & Plan Note (Signed)
Advance directive encouarged. Wife would be designated if he were incapacitated.

## 2021-08-18 NOTE — Assessment & Plan Note (Signed)
We talked about his prev imaging of his hips.  He improved with PT.  He'll update me as needed.  He is still cycling avidly.

## 2021-08-24 DIAGNOSIS — M9901 Segmental and somatic dysfunction of cervical region: Secondary | ICD-10-CM | POA: Diagnosis not present

## 2021-08-24 DIAGNOSIS — M9902 Segmental and somatic dysfunction of thoracic region: Secondary | ICD-10-CM | POA: Diagnosis not present

## 2021-08-24 DIAGNOSIS — M5033 Other cervical disc degeneration, cervicothoracic region: Secondary | ICD-10-CM | POA: Diagnosis not present

## 2021-08-24 DIAGNOSIS — M542 Cervicalgia: Secondary | ICD-10-CM | POA: Diagnosis not present

## 2021-09-08 ENCOUNTER — Ambulatory Visit: Payer: BC Managed Care – PPO | Admitting: Dermatology

## 2021-09-08 ENCOUNTER — Encounter: Payer: Self-pay | Admitting: Dermatology

## 2021-09-08 DIAGNOSIS — L570 Actinic keratosis: Secondary | ICD-10-CM

## 2021-09-08 DIAGNOSIS — L814 Other melanin hyperpigmentation: Secondary | ICD-10-CM

## 2021-09-08 DIAGNOSIS — L219 Seborrheic dermatitis, unspecified: Secondary | ICD-10-CM

## 2021-09-08 DIAGNOSIS — Z1283 Encounter for screening for malignant neoplasm of skin: Secondary | ICD-10-CM | POA: Diagnosis not present

## 2021-09-08 DIAGNOSIS — L578 Other skin changes due to chronic exposure to nonionizing radiation: Secondary | ICD-10-CM

## 2021-09-08 DIAGNOSIS — Z86018 Personal history of other benign neoplasm: Secondary | ICD-10-CM

## 2021-09-08 DIAGNOSIS — D1801 Hemangioma of skin and subcutaneous tissue: Secondary | ICD-10-CM

## 2021-09-08 DIAGNOSIS — D229 Melanocytic nevi, unspecified: Secondary | ICD-10-CM

## 2021-09-08 DIAGNOSIS — Z79899 Other long term (current) drug therapy: Secondary | ICD-10-CM

## 2021-09-08 DIAGNOSIS — Z85828 Personal history of other malignant neoplasm of skin: Secondary | ICD-10-CM

## 2021-09-08 DIAGNOSIS — L821 Other seborrheic keratosis: Secondary | ICD-10-CM

## 2021-09-08 DIAGNOSIS — D225 Melanocytic nevi of trunk: Secondary | ICD-10-CM

## 2021-09-08 MED ORDER — HYDROCORTISONE 2.5 % EX LOTN: TOPICAL_LOTION | CUTANEOUS | 11 refills | Status: DC

## 2021-09-08 MED ORDER — XOLEGEL 2 % EX GEL: 1.0000 | CUTANEOUS | 11 refills | Status: AC

## 2021-09-08 NOTE — Patient Instructions (Addendum)
Cryotherapy Aftercare  Wash gently with soap and water everyday.   Apply Vaseline and Band-Aid daily until healed.     Due to recent changes in healthcare laws, you may see results of your pathology and/or laboratory studies on MyChart before the doctors have had a chance to review them. We understand that in some cases there may be results that are confusing or concerning to you. Please understand that not all results are received at the same time and often the doctors may need to interpret multiple results in order to provide you with the best plan of care or course of treatment. Therefore, we ask that you please give us 2 business days to thoroughly review all your results before contacting the office for clarification. Should we see a critical lab result, you will be contacted sooner.   If You Need Anything After Your Visit  If you have any questions or concerns for your doctor, please call our main line at [phone] and press option 4 to reach your doctor's medical assistant. If no one answers, please leave a voicemail as directed and we will return your call as soon as possible. Messages left after 4 pm will be answered the following business day.   You may also send us a message via MyChart. We typically respond to MyChart messages within 1-2 business days.  For prescription refills, please ask your pharmacy to contact our office. Our fax number is [phone].  If you have an urgent issue when the clinic is closed that cannot wait until the next business day, you can page your doctor at the number below.    Please note that while we do our best to be available for urgent issues outside of office hours, we are not available 24/7.   If you have an urgent issue and are unable to reach us, you may choose to seek medical care at your doctor's office, retail clinic, urgent care center, or emergency room.  If you have a medical emergency, please immediately call 911 or go to the  emergency department.  Pager Numbers  - Dr. Kowalski: [phone]  - Dr. Moye: [phone]  - Dr. Stewart: [phone]  In the event of inclement weather, please call our main line at [phone] for an update on the status of any delays or closures.  Dermatology Medication Tips: Please keep the boxes that topical medications come in in order to help keep track of the instructions about where and how to use these. Pharmacies typically print the medication instructions only on the boxes and not directly on the medication tubes.   If your medication is too expensive, please contact our office at [phone] option 4 or send us a message through MyChart.   We are unable to tell what your co-pay for medications will be in advance as this is different depending on your insurance coverage. However, we may be able to find a substitute medication at lower cost or fill out paperwork to get insurance to cover a needed medication.   If a prior authorization is required to get your medication covered by your insurance company, please allow us 1-2 business days to complete this process.  Drug prices often vary depending on where the prescription is filled and some pharmacies may offer cheaper prices.  The website www.goodrx.com contains coupons for medications through different pharmacies. The prices here do not account for what the cost may be with help from insurance (it may be cheaper with your insurance), but the website can   give you the price if you did not use any insurance.  - You can print the associated coupon and take it with your prescription to the pharmacy.  - You may also stop by our office during regular business hours and pick up a GoodRx coupon card.  - If you need your prescription sent electronically to a different pharmacy, notify our office through [facility] MyChart or by phone at [phone] option 4.     Si Usted Necesita Algo Despus de Su Visita  Tambin puede  enviarnos un mensaje a travs de MyChart. Por lo general respondemos a los mensajes de MyChart en el transcurso de 1 a 2 das hbiles.  Para renovar recetas, por favor pida a su farmacia que se ponga en contacto con nuestra oficina. Nuestro nmero de fax es el [phone].  Si tiene un asunto urgente cuando la clnica est cerrada y que no puede esperar hasta el siguiente da hbil, puede llamar/localizar a su doctor(a) al nmero que aparece a continuacin.   Por favor, tenga en cuenta que aunque hacemos todo lo posible para estar disponibles para asuntos urgentes fuera del horario de oficina, no estamos disponibles las 24 horas del da, los 7 das de la semana.   Si tiene un problema urgente y no puede comunicarse con nosotros, puede optar por buscar atencin mdica  en el consultorio de su doctor(a), en una clnica privada, en un centro de atencin urgente o en una sala de emergencias.  Si tiene una emergencia mdica, por favor llame inmediatamente al 911 o vaya a la sala de emergencias.  Nmeros de bper  - Dr. Kowalski: [phone]  - Dra. Moye: [phone]  - Dra. Stewart: [phone]  En caso de inclemencias del tiempo, por favor llame a nuestra lnea principal al [phone] para una actualizacin sobre el estado de cualquier retraso o cierre.  Consejos para la medicacin en dermatologa: Por favor, guarde las cajas en las que vienen los medicamentos de uso tpico para ayudarle a seguir las instrucciones sobre dnde y cmo usarlos. Las farmacias generalmente imprimen las instrucciones del medicamento slo en las cajas y no directamente en los tubos del medicamento.   Si su medicamento es muy caro, por favor, pngase en contacto con nuestra oficina llamando al [phone] y presione la opcin 4 o envenos un mensaje a travs de MyChart.   No podemos decirle cul ser su copago por los medicamentos por adelantado ya que esto es diferente dependiendo de la cobertura de su seguro.  Sin embargo, es posible que podamos encontrar un medicamento sustituto a menor costo o llenar un formulario para que el seguro cubra el medicamento que se considera necesario.   Si se requiere una autorizacin previa para que su compaa de seguros cubra su medicamento, por favor permtanos de 1 a 2 das hbiles para completar este proceso.  Los precios de los medicamentos varan con frecuencia dependiendo del lugar de dnde se surte la receta y alguna farmacias pueden ofrecer precios ms baratos.  El sitio web www.goodrx.com tiene cupones para medicamentos de diferentes farmacias. Los precios aqu no tienen en cuenta lo que podra costar con la ayuda del seguro (puede ser ms barato con su seguro), pero el sitio web puede darle el precio si no utiliz ningn seguro.  - Puede imprimir el cupn correspondiente y llevarlo con su receta a la farmacia.  - Tambin puede pasar por nuestra oficina durante el horario de atencin regular y recoger una tarjeta de cupones de GoodRx.  -   Si necesita que su receta se enve electrnicamente a una farmacia diferente, informe a nuestra oficina a travs de MyChart de [place] o por telfono llamando al [phone] y presione la opcin 4.  

## 2021-09-08 NOTE — Progress Notes (Signed)
Follow-Up Visit   Subjective  Benjamin Simon is a 61 y.o. male who presents for the following: Total body skin exam (Hx of SCC L ant deltoid, hx of Dysplastic Nevus, R medial inf scapula) and Seborrheic Dermatitis (Face, 1 yr f/u, Xolegel 3d/wk, HC 2.5% lotion 3d/wk). The patient presents for Total-Body Skin Exam (TBSE) for skin cancer screening and mole check.  The patient has spots, moles and lesions to be evaluated, some may be new or changing and the patient has concerns that these could be cancer.   The following portions of the chart were reviewed this encounter and updated as appropriate:   Tobacco  Allergies  Meds  Problems  Med Hx  Surg Hx  Fam Hx     Review of Systems:  No other skin or systemic complaints except as noted in HPI or Assessment and Plan.  Objective  Well appearing patient in no apparent distress; mood and affect are within normal limits.  A full examination was performed including scalp, head, eyes, ears, nose, lips, neck, chest, axillae, abdomen, back, buttocks, bilateral upper extremities, bilateral lower extremities, hands, feet, fingers, toes, fingernails, and toenails. All findings within normal limits unless otherwise noted below.  face Pink patches with greasy scale.   Left Ear x 1 Pink scaly macules   Assessment & Plan   History of Squamous Cell Carcinoma in Situ of the Skin - No evidence of recurrence today - Recommend regular full body skin exams - Recommend daily broad spectrum sunscreen SPF 30+ to sun-exposed areas, reapply every 2 hours as needed.  - Call if any new or changing lesions are noted between office visits  - L anterior deltoid  History of Dysplastic Nevi - No evidence of recurrence today - Recommend regular full body skin exams - Recommend daily broad spectrum sunscreen SPF 30+ to sun-exposed areas, reapply every 2 hours as needed.  - Call if any new or changing lesions are noted between office visits  - R medial inf  scapula  Lentigines - Scattered tan macules - Due to sun exposure - Benign-appearing, observe - Recommend daily broad spectrum sunscreen SPF 30+ to sun-exposed areas, reapply every 2 hours as needed. - Call for any changes - back  Seborrheic Keratoses - Stuck-on, waxy, tan-brown papules and/or plaques  - Benign-appearing - Discussed benign etiology and prognosis. - Observe - Call for any changes - back  Melanocytic Nevi - Tan-brown and/or pink-flesh-colored symmetric macules and papules - Benign appearing on exam today - Observation - Call clinic for new or changing moles - Recommend daily use of broad spectrum spf 30+ sunscreen to sun-exposed areas.  - back  Hemangiomas - Red papules - Discussed benign nature - Observe - Call for any changes - trunk  Actinic Damage - Chronic condition, secondary to cumulative UV/sun exposure - diffuse scaly erythematous macules with underlying dyspigmentation - Recommend daily broad spectrum sunscreen SPF 30+ to sun-exposed areas, reapply every 2 hours as needed.  - Staying in the shade or wearing long sleeves, sun glasses (UVA+UVB protection) and wide brim hats (4-inch brim around the entire circumference of the hat) are also recommended for sun protection.  - Call for new or changing lesions.  Skin cancer screening performed today.   Seborrheic dermatitis face Chronic and persistent condition with duration or expected duration over one year. Condition is symptomatic / bothersome to patient. Not to goal. Seborrheic Dermatitis  -  is a chronic persistent rash characterized by pinkness and scaling most commonly  of the mid face but also can occur on the scalp (dandruff), ears; mid chest, mid back and groin.  It tends to be exacerbated by stress and cooler weather.  People who have neurologic disease may experience new onset or exacerbation of existing seborrheic dermatitis.  The condition is not curable but treatable and can be  controlled.  Restart and  Cont Xolegel hs 3d/wk, Monday, Wednesday, Friday Cont HC 2.5% lotion hs 3d/wk, Tuesday, Thursday, Friday  Ketoconazole (XOLEGEL) 2 % GEL - face Apply 1 Application topically 3 (three) times a week. 3 times weekly to face Monday, Wednesday and Friday hydrocortisone 2.5 % lotion - face Apply topically 3 (three) times a week. Apply to scaly areas on face 3 nights per week Tuesday, Thursday, [place], prn flares  AK (actinic keratosis) Left Ear x 1  Destruction of lesion - Left Ear x 1 Complexity: simple   Destruction method: cryotherapy   Informed consent: discussed and consent obtained   Timeout:  patient name, date of birth, surgical site, and procedure verified Lesion destroyed using liquid nitrogen: Yes   Region frozen until ice ball extended beyond lesion: Yes   Outcome: patient tolerated procedure well with no complications   Post-procedure details: wound care instructions given    Return in about 1 year (around 09/09/2022) for TBSE, Hx of SCC IS, Hx of AKs, Hx of Dysplastic nevi.  I, Othelia Pulling, RMA, am acting as scribe for Sarina Ser, MD . Documentation: I have reviewed the above documentation for accuracy and completeness, and I agree with the above.  Sarina Ser, MD

## 2021-09-10 ENCOUNTER — Encounter: Payer: Self-pay | Admitting: Dermatology

## 2021-09-21 DIAGNOSIS — M5033 Other cervical disc degeneration, cervicothoracic region: Secondary | ICD-10-CM | POA: Diagnosis not present

## 2021-09-21 DIAGNOSIS — M542 Cervicalgia: Secondary | ICD-10-CM | POA: Diagnosis not present

## 2021-09-21 DIAGNOSIS — M9902 Segmental and somatic dysfunction of thoracic region: Secondary | ICD-10-CM | POA: Diagnosis not present

## 2021-09-21 DIAGNOSIS — M9901 Segmental and somatic dysfunction of cervical region: Secondary | ICD-10-CM | POA: Diagnosis not present

## 2021-11-24 DIAGNOSIS — M5033 Other cervical disc degeneration, cervicothoracic region: Secondary | ICD-10-CM | POA: Diagnosis not present

## 2021-11-24 DIAGNOSIS — M9901 Segmental and somatic dysfunction of cervical region: Secondary | ICD-10-CM | POA: Diagnosis not present

## 2021-11-24 DIAGNOSIS — M9902 Segmental and somatic dysfunction of thoracic region: Secondary | ICD-10-CM | POA: Diagnosis not present

## 2021-11-24 DIAGNOSIS — M542 Cervicalgia: Secondary | ICD-10-CM | POA: Diagnosis not present

## 2021-12-30 DIAGNOSIS — M542 Cervicalgia: Secondary | ICD-10-CM | POA: Diagnosis not present

## 2021-12-30 DIAGNOSIS — M9902 Segmental and somatic dysfunction of thoracic region: Secondary | ICD-10-CM | POA: Diagnosis not present

## 2021-12-30 DIAGNOSIS — M9901 Segmental and somatic dysfunction of cervical region: Secondary | ICD-10-CM | POA: Diagnosis not present

## 2021-12-30 DIAGNOSIS — M5033 Other cervical disc degeneration, cervicothoracic region: Secondary | ICD-10-CM | POA: Diagnosis not present

## 2022-02-03 DIAGNOSIS — M9902 Segmental and somatic dysfunction of thoracic region: Secondary | ICD-10-CM | POA: Diagnosis not present

## 2022-02-03 DIAGNOSIS — M5033 Other cervical disc degeneration, cervicothoracic region: Secondary | ICD-10-CM | POA: Diagnosis not present

## 2022-02-03 DIAGNOSIS — M9901 Segmental and somatic dysfunction of cervical region: Secondary | ICD-10-CM | POA: Diagnosis not present

## 2022-02-03 DIAGNOSIS — M542 Cervicalgia: Secondary | ICD-10-CM | POA: Diagnosis not present

## 2022-03-07 DIAGNOSIS — M9901 Segmental and somatic dysfunction of cervical region: Secondary | ICD-10-CM | POA: Diagnosis not present

## 2022-03-07 DIAGNOSIS — M542 Cervicalgia: Secondary | ICD-10-CM | POA: Diagnosis not present

## 2022-03-07 DIAGNOSIS — M9902 Segmental and somatic dysfunction of thoracic region: Secondary | ICD-10-CM | POA: Diagnosis not present

## 2022-03-07 DIAGNOSIS — M5033 Other cervical disc degeneration, cervicothoracic region: Secondary | ICD-10-CM | POA: Diagnosis not present

## 2022-03-29 ENCOUNTER — Ambulatory Visit: Payer: BC Managed Care – PPO | Admitting: Family Medicine

## 2022-03-29 ENCOUNTER — Encounter: Payer: Self-pay | Admitting: Family Medicine

## 2022-03-29 VITALS — BP 110/70 | HR 60 | Temp 98.1°F | Ht 67.0 in | Wt 166.0 lb

## 2022-03-29 DIAGNOSIS — J029 Acute pharyngitis, unspecified: Secondary | ICD-10-CM | POA: Diagnosis not present

## 2022-03-29 LAB — POCT RAPID STREP A (OFFICE): Rapid Strep A Screen: NEGATIVE

## 2022-03-29 NOTE — Progress Notes (Unsigned)
Sx for a few weeks. ST, felt like his throat has sore glands.  He thought it was getting better then came back.  Some chest congestion. Fatigued.  Slight HA episodically, improved with tylenol. No FCNAVD.  Some mild R ear pain yesterday, not now.  No L ear sx.  Hearing is normal.  Pain with swallowing.  No facial pain.  No wheeze.  Neg covid test at home this AM.    Meds, vitals, and allergies reviewed.   ROS: Per HPI unless specifically indicated in ROS section   Nad ncat L TM wnl R TM with possibly scant amount of clear fluid.   Nasal exam slightly stuffy OP wnl, no OP erythema.  Mild B LA- 1 node on each side.  Neither is significantly enlarged. Neck supple,  Rrr Ctab Skin well perfused.    Strep test negative.  See result note.

## 2022-03-29 NOTE — Patient Instructions (Signed)
We'll update you about your lab.  Take care.  Glad to see you.

## 2022-03-30 DIAGNOSIS — J029 Acute pharyngitis, unspecified: Secondary | ICD-10-CM | POA: Insufficient documentation

## 2022-03-30 NOTE — Assessment & Plan Note (Signed)
I suspect this is a viral process and should resolve.  RST neg.  I would give this a few more days.  If the lymphadenopathy doesn't resolve, then he can let me know.

## 2022-04-07 DIAGNOSIS — M9901 Segmental and somatic dysfunction of cervical region: Secondary | ICD-10-CM | POA: Diagnosis not present

## 2022-04-07 DIAGNOSIS — M9902 Segmental and somatic dysfunction of thoracic region: Secondary | ICD-10-CM | POA: Diagnosis not present

## 2022-04-07 DIAGNOSIS — M542 Cervicalgia: Secondary | ICD-10-CM | POA: Diagnosis not present

## 2022-04-07 DIAGNOSIS — M5033 Other cervical disc degeneration, cervicothoracic region: Secondary | ICD-10-CM | POA: Diagnosis not present

## 2022-04-11 ENCOUNTER — Encounter: Payer: Self-pay | Admitting: Family Medicine

## 2022-04-11 ENCOUNTER — Ambulatory Visit (INDEPENDENT_AMBULATORY_CARE_PROVIDER_SITE_OTHER)
Admission: RE | Admit: 2022-04-11 | Discharge: 2022-04-11 | Disposition: A | Discharge status: Home / Self Care | Payer: BC Managed Care – PPO | Source: Ambulatory Visit | Attending: Family Medicine | Admitting: Family Medicine

## 2022-04-11 ENCOUNTER — Ambulatory Visit: Payer: BC Managed Care – PPO | Admitting: Family Medicine

## 2022-04-11 VITALS — BP 104/62 | HR 68 | Temp 98.0°F | Ht 67.0 in | Wt 163.0 lb

## 2022-04-11 DIAGNOSIS — R591 Generalized enlarged lymph nodes: Secondary | ICD-10-CM

## 2022-04-11 DIAGNOSIS — R5383 Other fatigue: Secondary | ICD-10-CM

## 2022-04-11 LAB — URINALYSIS, ROUTINE W REFLEX MICROSCOPIC
Bilirubin Urine: NEGATIVE
Hgb urine dipstick: NEGATIVE
Ketones, ur: NEGATIVE
Leukocytes,Ua: NEGATIVE
Nitrite: NEGATIVE
RBC / HPF: NONE SEEN (ref 0–?)
Specific Gravity, Urine: <=1.005 — AB (ref 1.000–1.030)
Total Protein, Urine: NEGATIVE
Urine Glucose: NEGATIVE
Urobilinogen, UA: 0.2 (ref 0.0–1.0)
WBC, UA: NONE SEEN (ref 0–?)
pH: 6 (ref 5.0–8.0)

## 2022-04-11 LAB — SEDIMENTATION RATE: Sed Rate: 49 mm/hr — ABNORMAL HIGH (ref 0–20)

## 2022-04-11 LAB — COMPREHENSIVE METABOLIC PANEL
ALT: 21 U/L (ref 0–53)
AST: 19 U/L (ref 0–37)
Albumin: 4.1 g/dL (ref 3.5–5.2)
Alkaline Phosphatase: 82 U/L (ref 39–117)
BUN: 14 mg/dL (ref 6–23)
CO2: 29 mEq/L (ref 19–32)
Calcium: 9.7 mg/dL (ref 8.4–10.5)
Chloride: 101 mEq/L (ref 96–112)
Creatinine, Ser: 0.86 mg/dL (ref 0.40–1.50)
GFR: 94.38 mL/min (ref >60.00)
Glucose, Bld: 75 mg/dL (ref 70–99)
Potassium: 4.8 mEq/L (ref 3.5–5.1)
Sodium: 139 mEq/L (ref 135–145)
Total Bilirubin: 0.4 mg/dL (ref 0.2–1.2)
Total Protein: 6.8 g/dL (ref 6.0–8.3)

## 2022-04-11 LAB — TSH: TSH: 0.01 u[IU]/mL — ABNORMAL LOW (ref 0.35–5.50)

## 2022-04-11 LAB — MONONUCLEOSIS SCREEN: Mono Screen: NEGATIVE

## 2022-04-11 NOTE — Progress Notes (Signed)
Prev HX noted:  Sx for a few weeks. ST, felt like his throat has sore glands.  He thought it was getting better then came back.  Some chest congestion. Fatigued.  Slight HA episodically, improved with tylenol. No FCNAVD.  Some mild R ear pain yesterday, not now.  No L ear sx.  Hearing is normal.  Pain with swallowing.  No facial pain.  No wheeze.  Neg covid test at home this AM.    RST net at that point.   =================== Update from today- Fatigue and HA persist.  Still with some soreness in the neck, in the glands but not a specific/primary ST.  Frontal HA.  Some R ear pain, episodically.  Some sweats occ.  No known fever.  No vomiting. Some nausea.  Dec in appetite.  Voice is slightly hoarse.   He hasn't felt like cycling and that is atypical.   Sx started after initial shingles shot. D/w pt.     Meds, vitals, and allergies reviewed.   ROS: Per HPI unless specifically indicated in ROS section   Nad Ncat TM w/o erythema, right serous otitis media noted. Nasal exam slightly stuffy. OP wnl except for minimal cobblestoning in the posterior pharynx. He has 1 small right-sided lymph node in the anterior chain that is palpable but not pathologically enlarged. No clavicular or axillary LA B Rrr Ctab Skin well-perfused.

## 2022-04-11 NOTE — Patient Instructions (Signed)
Go to the lab on the way out.   If you have mychart we'll likely use that to update you.    Take care.  Glad to see you. 

## 2022-04-12 LAB — CBC WITH DIFFERENTIAL/PLATELET
Absolute Monocytes: 595 cells/uL (ref 200–950)
Basophils Absolute: 19 cells/uL (ref 0–200)
Basophils Relative: 0.3 %
Eosinophils Absolute: 19 cells/uL (ref 15–500)
Eosinophils Relative: 0.3 %
HCT: 40.7 % (ref 38.5–50.0)
Hemoglobin: 14 g/dL (ref 13.2–17.1)
Lymphs Abs: 1344 cells/uL (ref 850–3900)
MCH: 31.5 pg (ref 27.0–33.0)
MCHC: 34.4 g/dL (ref 32.0–36.0)
MCV: 91.7 fL (ref 80.0–100.0)
MPV: 9.9 fL (ref 7.5–12.5)
Monocytes Relative: 9.3 %
Neutro Abs: 4422 cells/uL (ref 1500–7800)
Neutrophils Relative %: 69.1 %
Platelets: 313 10*3/uL (ref 140–400)
RBC: 4.44 10*6/uL (ref 4.20–5.80)
RDW: 11.1 % (ref 11.0–15.0)
Total Lymphocyte: 21 %
WBC: 6.4 10*3/uL (ref 3.8–10.8)

## 2022-04-12 LAB — PATHOLOGIST SMEAR REVIEW

## 2022-04-13 ENCOUNTER — Other Ambulatory Visit: Payer: Self-pay | Admitting: Family Medicine

## 2022-04-13 DIAGNOSIS — R591 Generalized enlarged lymph nodes: Secondary | ICD-10-CM | POA: Insufficient documentation

## 2022-04-13 DIAGNOSIS — E0590 Thyrotoxicosis, unspecified without thyrotoxic crisis or storm: Secondary | ICD-10-CM | POA: Insufficient documentation

## 2022-04-13 NOTE — Assessment & Plan Note (Signed)
Of unclear source, with fatigue noted.  With persisting symptoms.  Discussed checking basic labs and x-ray today.  See notes on imaging and labs.  At this point still okay for outpatient follow-up.

## 2022-04-14 ENCOUNTER — Encounter: Payer: Self-pay | Admitting: *Deleted

## 2022-04-21 ENCOUNTER — Other Ambulatory Visit (INDEPENDENT_AMBULATORY_CARE_PROVIDER_SITE_OTHER): Payer: BC Managed Care – PPO

## 2022-04-21 DIAGNOSIS — E0590 Thyrotoxicosis, unspecified without thyrotoxic crisis or storm: Secondary | ICD-10-CM

## 2022-04-22 LAB — T4, FREE: Free T4: 0.97 ng/dL (ref 0.60–1.60)

## 2022-04-22 LAB — T3, FREE: T3, Free: 3.3 pg/mL (ref 2.3–4.2)

## 2022-04-27 ENCOUNTER — Telehealth: Payer: Self-pay | Admitting: Family Medicine

## 2022-04-27 LAB — THYROID STIMULATING IMMUNOGLOBULIN: TSI: <89 % baseline (ref <140)

## 2022-04-27 LAB — THYROID PEROXIDASE ANTIBODIES (TPO) (REFL): Thyroperoxidase Ab SerPl-aCnc: 6 IU/mL (ref <9)

## 2022-04-27 NOTE — Telephone Encounter (Signed)
Spoke with patient and advised Dr. Para March still wants US done and that 1 lab is still pending. We will update him once lab has returned.

## 2022-04-27 NOTE — Telephone Encounter (Signed)
I would still get the ultrasound done.  I am glad he is feeling better.  He still has 1 lab that is pending and I am awaiting that result.  Thanks.

## 2022-04-27 NOTE — Telephone Encounter (Signed)
Patient called in stating that his labs came back fine and he is feeling fine,do he still need the ultrasound on his thyroid?

## 2022-04-28 ENCOUNTER — Ambulatory Visit
Admission: RE | Admit: 2022-04-28 | Discharge: 2022-04-28 | Disposition: A | Discharge status: Home / Self Care | Payer: BC Managed Care – PPO | Source: Ambulatory Visit | Attending: Family Medicine | Admitting: Family Medicine

## 2022-04-28 DIAGNOSIS — E0590 Thyrotoxicosis, unspecified without thyrotoxic crisis or storm: Secondary | ICD-10-CM

## 2022-04-28 DIAGNOSIS — E039 Hypothyroidism, unspecified: Secondary | ICD-10-CM | POA: Diagnosis not present

## 2022-05-06 DIAGNOSIS — M5033 Other cervical disc degeneration, cervicothoracic region: Secondary | ICD-10-CM | POA: Diagnosis not present

## 2022-05-06 DIAGNOSIS — M9901 Segmental and somatic dysfunction of cervical region: Secondary | ICD-10-CM | POA: Diagnosis not present

## 2022-05-06 DIAGNOSIS — M542 Cervicalgia: Secondary | ICD-10-CM | POA: Diagnosis not present

## 2022-05-06 DIAGNOSIS — M9902 Segmental and somatic dysfunction of thoracic region: Secondary | ICD-10-CM | POA: Diagnosis not present

## 2022-05-31 ENCOUNTER — Encounter: Payer: Self-pay | Admitting: Family Medicine

## 2022-06-02 DIAGNOSIS — M9902 Segmental and somatic dysfunction of thoracic region: Secondary | ICD-10-CM | POA: Diagnosis not present

## 2022-06-02 DIAGNOSIS — M5033 Other cervical disc degeneration, cervicothoracic region: Secondary | ICD-10-CM | POA: Diagnosis not present

## 2022-06-02 DIAGNOSIS — M9901 Segmental and somatic dysfunction of cervical region: Secondary | ICD-10-CM | POA: Diagnosis not present

## 2022-06-02 DIAGNOSIS — M542 Cervicalgia: Secondary | ICD-10-CM | POA: Diagnosis not present

## 2022-06-06 DIAGNOSIS — R946 Abnormal results of thyroid function studies: Secondary | ICD-10-CM | POA: Diagnosis not present

## 2022-06-30 DIAGNOSIS — M5033 Other cervical disc degeneration, cervicothoracic region: Secondary | ICD-10-CM | POA: Diagnosis not present

## 2022-06-30 DIAGNOSIS — M542 Cervicalgia: Secondary | ICD-10-CM | POA: Diagnosis not present

## 2022-06-30 DIAGNOSIS — M9901 Segmental and somatic dysfunction of cervical region: Secondary | ICD-10-CM | POA: Diagnosis not present

## 2022-06-30 DIAGNOSIS — M9902 Segmental and somatic dysfunction of thoracic region: Secondary | ICD-10-CM | POA: Diagnosis not present

## 2022-08-04 DIAGNOSIS — M9902 Segmental and somatic dysfunction of thoracic region: Secondary | ICD-10-CM | POA: Diagnosis not present

## 2022-08-04 DIAGNOSIS — M9901 Segmental and somatic dysfunction of cervical region: Secondary | ICD-10-CM | POA: Diagnosis not present

## 2022-08-04 DIAGNOSIS — M5033 Other cervical disc degeneration, cervicothoracic region: Secondary | ICD-10-CM | POA: Diagnosis not present

## 2022-08-04 DIAGNOSIS — M542 Cervicalgia: Secondary | ICD-10-CM | POA: Diagnosis not present

## 2022-08-17 ENCOUNTER — Ambulatory Visit: Payer: BC Managed Care – PPO | Admitting: Dermatology

## 2022-08-17 VITALS — BP 105/68

## 2022-08-17 DIAGNOSIS — Z7189 Other specified counseling: Secondary | ICD-10-CM

## 2022-08-17 DIAGNOSIS — D492 Neoplasm of unspecified behavior of bone, soft tissue, and skin: Secondary | ICD-10-CM

## 2022-08-17 DIAGNOSIS — Z872 Personal history of diseases of the skin and subcutaneous tissue: Secondary | ICD-10-CM

## 2022-08-17 DIAGNOSIS — L578 Other skin changes due to chronic exposure to nonionizing radiation: Secondary | ICD-10-CM | POA: Diagnosis not present

## 2022-08-17 DIAGNOSIS — Z8589 Personal history of malignant neoplasm of other organs and systems: Secondary | ICD-10-CM

## 2022-08-17 DIAGNOSIS — Z86007 Personal history of in-situ neoplasm of skin: Secondary | ICD-10-CM

## 2022-08-17 DIAGNOSIS — L820 Inflamed seborrheic keratosis: Secondary | ICD-10-CM

## 2022-08-17 DIAGNOSIS — Z1283 Encounter for screening for malignant neoplasm of skin: Secondary | ICD-10-CM | POA: Diagnosis not present

## 2022-08-17 DIAGNOSIS — L814 Other melanin hyperpigmentation: Secondary | ICD-10-CM | POA: Diagnosis not present

## 2022-08-17 DIAGNOSIS — L219 Seborrheic dermatitis, unspecified: Secondary | ICD-10-CM

## 2022-08-17 DIAGNOSIS — D229 Melanocytic nevi, unspecified: Secondary | ICD-10-CM

## 2022-08-17 DIAGNOSIS — D1801 Hemangioma of skin and subcutaneous tissue: Secondary | ICD-10-CM

## 2022-08-17 DIAGNOSIS — L859 Epidermal thickening, unspecified: Secondary | ICD-10-CM

## 2022-08-17 DIAGNOSIS — W908XXA Exposure to other nonionizing radiation, initial encounter: Secondary | ICD-10-CM | POA: Diagnosis not present

## 2022-08-17 DIAGNOSIS — Z79899 Other long term (current) drug therapy: Secondary | ICD-10-CM

## 2022-08-17 DIAGNOSIS — Z86018 Personal history of other benign neoplasm: Secondary | ICD-10-CM

## 2022-08-17 DIAGNOSIS — L821 Other seborrheic keratosis: Secondary | ICD-10-CM

## 2022-08-17 MED ORDER — XOLEGEL 2 % EX GEL: 1.0000 | CUTANEOUS | 11 refills | Status: AC

## 2022-08-17 MED ORDER — HYDROCORTISONE 2.5 % EX LOTN: TOPICAL_LOTION | CUTANEOUS | 11 refills | Status: AC

## 2022-08-17 NOTE — Patient Instructions (Addendum)

## 2022-08-17 NOTE — Progress Notes (Signed)
Follow-Up Visit   Subjective  Benjamin Simon is a 62 y.o. male who presents for the following: Skin Cancer Screening and Full Body Skin Exam, hx of SCC IS, hx of Dysplastic nevus, hx of Aks, Seb Derm, face, 41yr f/u, Xolegel 3d/wk, HC 2.5% lotion 3d/wk, check spot L knee, tingles prn   The patient presents for Total-Body Skin Exam (TBSE) for skin cancer screening and mole check. The patient has spots, moles and lesions to be evaluated, some may be new or changing and the patient may have concern these could be cancer.    The following portions of the chart were reviewed this encounter and updated as appropriate: medications, allergies, medical history  Review of Systems:  No other skin or systemic complaints except as noted in HPI or Assessment and Plan.  Objective  Well appearing patient in no apparent distress; mood and affect are within normal limits.  A full examination was performed including scalp, head, eyes, ears, nose, lips, neck, chest, axillae, abdomen, back, buttocks, bilateral upper extremities, bilateral lower extremities, hands, feet, fingers, toes, fingernails, and toenails. All findings within normal limits unless otherwise noted below.   Relevant physical exam findings are noted in the Assessment and Plan.  L sideburn x 1, R eyebrow x 1, L temple within hairline x 1 (3) Stuck on waxy paps with erythema  L sup knee 0.5cm flesh colored firm pap       Assessment & Plan   SKIN CANCER SCREENING PERFORMED TODAY.  ACTINIC DAMAGE - Chronic condition, secondary to cumulative UV/sun exposure - diffuse scaly erythematous macules with underlying dyspigmentation - Recommend daily broad spectrum sunscreen SPF 30+ to sun-exposed areas, reapply every 2 hours as needed.  - Staying in the shade or wearing long sleeves, sun glasses (UVA+UVB protection) and wide brim hats (4-inch brim around the entire circumference of the hat) are also recommended for sun protection.  -  Call for new or changing lesions.  LENTIGINES, SEBORRHEIC KERATOSES, HEMANGIOMAS - Benign normal skin lesions - Benign-appearing - Call for any changes  MELANOCYTIC NEVI - Tan-brown and/or pink-flesh-colored symmetric macules and papules - Benign appearing on exam today - Observation - Call clinic for new or changing moles - Recommend daily use of broad spectrum spf 30+ sunscreen to sun-exposed areas.   HISTORY OF SQUAMOUS CELL CARCINOMA IN SITU OF THE SKIN - No evidence of recurrence today - Recommend regular full body skin exams - Recommend daily broad spectrum sunscreen SPF 30+ to sun-exposed areas, reapply every 2 hours as needed.  - Call if any new or changing lesions are noted between office visits  - L anterior deltoid 08/20/2015  HISTORY OF DYSPLASTIC NEVUS No evidence of recurrence today Recommend regular full body skin exams Recommend daily broad spectrum sunscreen SPF 30+ to sun-exposed areas, reapply every 2 hours as needed.  Call if any new or changing lesions are noted between office visits  -R medial scapula, 10/05/2016  HISTORY OF PRECANCEROUS ACTINIC KERATOSIS - site(s) of PreCancerous Actinic Keratosis clear today. - these may recur and new lesions may form requiring treatment to prevent transformation into skin cancer - observe for new or changing spots and contact [facility] for appointment if occur - photoprotection with sun protective clothing; sunglasses and broad spectrum sunscreen with SPF of at least 30 + and frequent self skin exams recommended - yearly exams by a dermatologist recommended for persons with history of PreCancerous Actinic Keratoses   SEBORRHEIC DERMATITIS face Exam: face clear today  Chronic condition with duration or expected duration over one year. Currently well-controlled.   Seborrheic Dermatitis is a chronic persistent rash characterized by pinkness and scaling most commonly of the mid face but also can occur on the  scalp (dandruff), ears; mid chest, mid back and groin.  It tends to be exacerbated by stress and cooler weather.  People who have neurologic disease may experience new onset or exacerbation of existing seborrheic dermatitis.  The condition is not curable but treatable and can be controlled.  Treatment Plan: Cont Xolegel cr 3d/wk, Monday, Wednesday, Friday Cont HC 2.5% lotion 3d/wk, Tuesday, Thursday, Saturday  Long term medication management.  Patient is using long term (months to years) prescription medication  to control their dermatologic condition.  These medications require periodic monitoring to evaluate for efficacy and side effects and may require periodic laboratory monitoring.   Topical steroids (such as triamcinolone, fluocinolone, fluocinonide, mometasone, clobetasol, halobetasol, betamethasone, hydrocortisone) can cause thinning and lightening of the skin if they are used for too long in the same area. Your physician has selected the right strength medicine for your problem and area affected on the body. Please use your medication only as directed by your physician to prevent side effects.     Inflamed seborrheic keratosis (3) L sideburn x 1, R eyebrow x 1, L temple within hairline x 1  Symptomatic, irritating, patient would like treated.  L temple within hairline, pt instructed to RTC if not clear in 6 wks   Destruction of lesion - L sideburn x 1, R eyebrow x 1, L temple within hairline x 1 (3) Complexity: simple   Destruction method: cryotherapy   Informed consent: discussed and consent obtained   Timeout:  patient name, date of birth, surgical site, and procedure verified Lesion destroyed using liquid nitrogen: Yes   Region frozen until ice ball extended beyond lesion: Yes   Outcome: patient tolerated procedure well with no complications   Post-procedure details: wound care instructions given    Neoplasm of skin L sup knee  Epidermal / dermal shaving  Lesion diameter  (cm):  0.5 Informed consent: discussed and consent obtained   Timeout: patient name, date of birth, surgical site, and procedure verified   Procedure prep:  Patient was prepped and draped in usual sterile fashion Prep type:  Isopropyl alcohol Anesthesia: the lesion was anesthetized in a standard fashion   Anesthetic:  1% lidocaine w/ epinephrine 1-100,000 buffered w/ 8.4% NaHCO3 Instrument used: flexible razor blade   Hemostasis achieved with: pressure, aluminum chloride and electrodesiccation   Outcome: patient tolerated procedure well   Post-procedure details: sterile dressing applied and wound care instructions given   Dressing type: bandage and bacitracin    Specimen 1 - Surgical pathology Differential Diagnosis: D48.5 Irritated Dermatofibroma vs other  Check Margins: yes 0.5cm flesh colored firm pap    Return in about 1 year (around 08/17/2023) for TBSE, Hx of SCC IS, Hx of Dysplastic nevi, Hx of AKs.  I, Ardis Rowan, RMA, am acting as scribe for Armida Sans, MD .   Documentation: I have reviewed the above documentation for accuracy and completeness, and I agree with the above.  Armida Sans, MD

## 2022-08-23 ENCOUNTER — Telehealth: Payer: Self-pay

## 2022-08-23 NOTE — Telephone Encounter (Addendum)
Called and discussed bx results and recommendations with patient. Patient verbalized understanding and denied further questions. Will recheck at next follow up  ----- Message from Armida Sans sent at 08/22/2022  5:47 PM EDT ----- Diagnosis Skin , left sup knee BENIGN EPIDERMAL HYPERPLASIA, DEEP MARGIN INVOLVED, SEE DESCRIPTION  Benign area of thickened skin No further treatment needed unless persists or recurs Recheck next visit

## 2022-08-28 ENCOUNTER — Encounter: Payer: Self-pay | Admitting: Dermatology

## 2022-08-29 DIAGNOSIS — M9902 Segmental and somatic dysfunction of thoracic region: Secondary | ICD-10-CM | POA: Diagnosis not present

## 2022-08-29 DIAGNOSIS — M542 Cervicalgia: Secondary | ICD-10-CM | POA: Diagnosis not present

## 2022-08-29 DIAGNOSIS — M5033 Other cervical disc degeneration, cervicothoracic region: Secondary | ICD-10-CM | POA: Diagnosis not present

## 2022-08-29 DIAGNOSIS — M9901 Segmental and somatic dysfunction of cervical region: Secondary | ICD-10-CM | POA: Diagnosis not present

## 2022-08-31 DIAGNOSIS — E039 Hypothyroidism, unspecified: Secondary | ICD-10-CM | POA: Diagnosis not present

## 2022-09-20 DIAGNOSIS — M9902 Segmental and somatic dysfunction of thoracic region: Secondary | ICD-10-CM | POA: Diagnosis not present

## 2022-09-20 DIAGNOSIS — M5033 Other cervical disc degeneration, cervicothoracic region: Secondary | ICD-10-CM | POA: Diagnosis not present

## 2022-09-20 DIAGNOSIS — M542 Cervicalgia: Secondary | ICD-10-CM | POA: Diagnosis not present

## 2022-09-20 DIAGNOSIS — M9901 Segmental and somatic dysfunction of cervical region: Secondary | ICD-10-CM | POA: Diagnosis not present

## 2022-09-29 ENCOUNTER — Ambulatory Visit: Payer: BC Managed Care – PPO | Admitting: Dermatology

## 2022-10-18 DIAGNOSIS — M9902 Segmental and somatic dysfunction of thoracic region: Secondary | ICD-10-CM | POA: Diagnosis not present

## 2022-10-18 DIAGNOSIS — M9901 Segmental and somatic dysfunction of cervical region: Secondary | ICD-10-CM | POA: Diagnosis not present

## 2022-10-18 DIAGNOSIS — M5033 Other cervical disc degeneration, cervicothoracic region: Secondary | ICD-10-CM | POA: Diagnosis not present

## 2022-10-18 DIAGNOSIS — M542 Cervicalgia: Secondary | ICD-10-CM | POA: Diagnosis not present

## 2022-11-15 DIAGNOSIS — M62830 Muscle spasm of back: Secondary | ICD-10-CM | POA: Diagnosis not present

## 2022-11-15 DIAGNOSIS — M5416 Radiculopathy, lumbar region: Secondary | ICD-10-CM | POA: Diagnosis not present

## 2022-11-15 DIAGNOSIS — M9902 Segmental and somatic dysfunction of thoracic region: Secondary | ICD-10-CM | POA: Diagnosis not present

## 2022-11-15 DIAGNOSIS — M9903 Segmental and somatic dysfunction of lumbar region: Secondary | ICD-10-CM | POA: Diagnosis not present

## 2022-12-15 DIAGNOSIS — M9903 Segmental and somatic dysfunction of lumbar region: Secondary | ICD-10-CM | POA: Diagnosis not present

## 2022-12-15 DIAGNOSIS — M62830 Muscle spasm of back: Secondary | ICD-10-CM | POA: Diagnosis not present

## 2022-12-15 DIAGNOSIS — M5416 Radiculopathy, lumbar region: Secondary | ICD-10-CM | POA: Diagnosis not present

## 2022-12-15 DIAGNOSIS — M9902 Segmental and somatic dysfunction of thoracic region: Secondary | ICD-10-CM | POA: Diagnosis not present

## 2023-01-12 DIAGNOSIS — M9902 Segmental and somatic dysfunction of thoracic region: Secondary | ICD-10-CM | POA: Diagnosis not present

## 2023-01-12 DIAGNOSIS — M5416 Radiculopathy, lumbar region: Secondary | ICD-10-CM | POA: Diagnosis not present

## 2023-01-12 DIAGNOSIS — M9903 Segmental and somatic dysfunction of lumbar region: Secondary | ICD-10-CM | POA: Diagnosis not present

## 2023-01-12 DIAGNOSIS — M62830 Muscle spasm of back: Secondary | ICD-10-CM | POA: Diagnosis not present

## 2023-02-16 DIAGNOSIS — M9902 Segmental and somatic dysfunction of thoracic region: Secondary | ICD-10-CM | POA: Diagnosis not present

## 2023-02-16 DIAGNOSIS — M62830 Muscle spasm of back: Secondary | ICD-10-CM | POA: Diagnosis not present

## 2023-02-16 DIAGNOSIS — M5416 Radiculopathy, lumbar region: Secondary | ICD-10-CM | POA: Diagnosis not present

## 2023-02-16 DIAGNOSIS — M9903 Segmental and somatic dysfunction of lumbar region: Secondary | ICD-10-CM | POA: Diagnosis not present

## 2023-03-20 ENCOUNTER — Ambulatory Visit: Payer: Self-pay | Admitting: Family Medicine

## 2023-03-20 NOTE — Telephone Encounter (Signed)
  Chief Complaint: sore throat Symptoms: occasional cough with white sputum, sore throat and white patches on tongue Frequency: started on 03/11/23 Pertinent Negatives: Patient denies hemoptysis, fever, SOB, chest pain Disposition: [] ED /[] Urgent Care (no appt availability in office) / [] Appointment(In office/virtual)/ []  [facility]/ [] Home Care/ [] Refused Recommended Disposition /[] [facility]/ []  Follow-up with PCP Additional Notes: Patient states he went on a ski trip to [place] and flew back on 03/11/23. He states for a week (3/8-3/15) he had a cough that he was treating at home. Patient states his concern is that he now has a sore throat and white patches on his tongue as well. Offered in person and virtual appointments, patient states he prefers to come in on Thursday. Patient verbalizes understanding to call back for new or worsening symptoms.  Summary: Cough/Sore Throat   Copied From CRM [id number]. Reason for Triage: Patient experiencing cough, flem, sore throat and would like antibiotics         Reason for Disposition  [1] Sore throat with cough/cold symptoms AND [2] present > 5 days  Answer Assessment - Initial Assessment Questions 1. ONSET: "When did the cough begin?"      X 1 week, started on 3/8 and has improved since 3/15.  2. SEVERITY: "How bad is the cough today?"      He states it is still lingering with some sputum coming up. Occasional.  3. SPUTUM: "Describe the color of your sputum" (none, dry cough; clear, white, yellow, green)     White.  4. HEMOPTYSIS: "Are you coughing up any blood?" If so ask: "How much?" (flecks, streaks, tablespoons, etc.)     Denies.  5. DIFFICULTY BREATHING: "Are you having difficulty breathing?" If Yes, ask: "How bad is it?" (e.g., mild, moderate, severe)    - MILD: No SOB at rest, mild SOB with walking, speaks normally in sentences, can lie down, no retractions, pulse < 100.    - MODERATE: SOB at rest, SOB with minimal  exertion and prefers to sit, cannot lie down flat, speaks in phrases, mild retractions, audible wheezing, pulse 100-120.    - SEVERE: Very SOB at rest, speaks in single words, struggling to breathe, sitting hunched forward, retractions, pulse > 120      Denies.  6. FEVER: "Do you have a fever?" If Yes, ask: "What is your temperature, how was it measured, and when did it start?"     Denies.  7. CARDIAC HISTORY: "Do you have any history of heart disease?" (e.g., heart attack, congestive heart failure)      Denies.  8. LUNG HISTORY: "Do you have any history of lung disease?"  (e.g., pulmonary embolus, asthma, emphysema)     Denies.  9. PE RISK FACTORS: "Do you have a history of blood clots?" (or: recent major surgery, recent prolonged travel, bedridden)     Denies.  10. OTHER SYMPTOMS: "Do you have any other symptoms?" (e.g., runny nose, wheezing, chest pain)       3-4/10 Sore throat with white patches on tongue, lethargic.  11. PREGNANCY: "Is there any chance you are pregnant?" "When was your last menstrual period?"       N/A.  12. TRAVEL: "Have you traveled out of the country in the last month?" (e.g., travel history, exposures)       Patient was skiing in [place] and flew back on 3/8 and thinks that is how he caught this.  Protocols used: Cough - Acute Productive-A-AH, Sore Throat-A-AH

## 2023-03-20 NOTE — Telephone Encounter (Signed)
 Noted. Thanks.

## 2023-03-22 DIAGNOSIS — M9902 Segmental and somatic dysfunction of thoracic region: Secondary | ICD-10-CM | POA: Diagnosis not present

## 2023-03-22 DIAGNOSIS — M62830 Muscle spasm of back: Secondary | ICD-10-CM | POA: Diagnosis not present

## 2023-03-22 DIAGNOSIS — M9903 Segmental and somatic dysfunction of lumbar region: Secondary | ICD-10-CM | POA: Diagnosis not present

## 2023-03-22 DIAGNOSIS — M5416 Radiculopathy, lumbar region: Secondary | ICD-10-CM | POA: Diagnosis not present

## 2023-03-23 ENCOUNTER — Ambulatory Visit: Admitting: Family Medicine

## 2023-03-23 ENCOUNTER — Encounter: Payer: Self-pay | Admitting: Family Medicine

## 2023-03-23 VITALS — BP 122/74 | HR 59 | Temp 97.8°F | Ht 67.0 in | Wt 169.4 lb

## 2023-03-23 DIAGNOSIS — K1370 Unspecified lesions of oral mucosa: Secondary | ICD-10-CM

## 2023-03-23 NOTE — Assessment & Plan Note (Addendum)
 Acute, no clear sign of bacterial infection, no suggestion of strep throat (no sore throat or exudate), symptoms not consistent with Candida. Inflamed area likely due to recent viral infection. Patient is slightly higher risk for oral cancer given dip use.  Recommend following over time, if not improving over the next couple of weeks, consider referral to ENT for evaluation.  No red flags for anaphylaxis or respiratory compromise.  Return and ER precautions provided.

## 2023-03-23 NOTE — Progress Notes (Signed)
 Patient ID: Waldron Session, male    DOB: Jun 28, 1960, 63 y.o.   MRN: [id number]  This visit was conducted in person.  BP 122/74   Pulse (!) 59   Temp 97.8 F (36.6 C) (Oral)   Ht 5\' 7"  (1.702 m)   Wt 169 lb 6 oz (76.8 kg)   SpO2 97%   BMI 26.53 kg/m    CC:  Chief Complaint  Patient presents with   Sore Throat    C/o white streak on tongue after having ST and cough 1 wk ago. Cough and ST have resolved.     Subjective:   HPI: Benjamin Simon is a 63 y.o. male presenting on 03/23/2023 for Sore Throat (C/o white streak on tongue after having ST and cough 1 wk ago. Cough and ST have resolved. )   Date of onset:  1 week , symptoms started after a ski trip Initial symptoms included  ST and cough, no fever Symptoms progressed to resolved symptoms  except has noted white patches on tongue.Marland Kitchen area is not sore   No  lip swelling, no SOB  no tongue sweeling.   Sick contacts: friend ill no thing specific COVID testing:   none     He has tried to treat with  ibuprofen, tylenol     No history of chronic lung disease such as asthma or COPD.   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       Relevant past medical, surgical, family and social history reviewed and updated as indicated. Interim medical history since our last visit reviewed. Allergies and medications reviewed and updated. Outpatient Medications Prior to Visit  Medication Sig Dispense Refill   acetaminophen (TYLENOL) 325 MG tablet Take 650 mg by mouth every 4 (four) hours as needed.     fluticasone (FLONASE) 50 MCG/ACT nasal spray Place into both nostrils daily.     hydrocortisone 2.5 % lotion Apply topically 3 (three) times a week. Apply to face 3 times weekly, Tuesday, Thursday, Saturday 59 mL 11   ibuprofen (ADVIL,MOTRIN) 200 MG tablet Take 200 mg by mouth every 6 (six) hours as needed.     Ketoconazole (XOLEGEL) 2 % GEL Apply 1 Application topically 3 (three) times a week. 3 times weekly to face  Monday, Wednesday and Friday 45 g 11   Ketoconazole (XOLEGEL) 2 % GEL Apply 1 Application topically 3 (three) times a week. Apply to face 3 times weekly, Monday, Wednesday, Friday 45 g 11   levothyroxine (SYNTHROID) 50 MCG tablet PLEASE SEE ATTACHED FOR DETAILED DIRECTIONS     No facility-administered medications prior to visit.     Per HPI unless specifically indicated in ROS section below Review of Systems  Constitutional:  Negative for fatigue and fever.  HENT:  Negative for ear pain.   Eyes:  Negative for pain.  Respiratory:  Negative for cough and shortness of breath.   Cardiovascular:  Negative for chest pain, palpitations and leg swelling.  Gastrointestinal:  Negative for abdominal pain.  Genitourinary:  Negative for dysuria.  Musculoskeletal:  Negative for arthralgias.  Neurological:  Negative for syncope, light-headedness and headaches.  Psychiatric/Behavioral:  Negative for dysphoric mood.    Objective:  BP 122/74   Pulse (!) 59   Temp 97.8 F (36.6 C) (Oral)   Ht 5\' 7"  (1.702 m)   Wt 169 lb 6 oz (76.8 kg)   SpO2 97%   BMI 26.53 kg/m   Wt Readings  from Last 3 Encounters:  03/23/23 169 lb 6 oz (76.8 kg)  04/11/22 163 lb (73.9 kg)  03/29/22 166 lb (75.3 kg)      Physical Exam Vitals reviewed.  Constitutional:      Appearance: He is well-developed.  HENT:     Head: Normocephalic.     Right Ear: Hearing normal.     Left Ear: Hearing normal.     Nose: Nose normal. No congestion or rhinorrhea.     Mouth/Throat:     Mouth: Mucous membranes are dry. Oral lesions present.     Tonsils: No tonsillar exudate or tonsillar abscesses.     Comments:  At base of tongue in posterior orpopharynx.Marland Kitchen there is more prominent ridge on right side compared to left, no exudate no  vesicles Neck:     Thyroid: No thyroid mass or thyromegaly.     Vascular: No carotid bruit.     Trachea: Trachea normal.  Cardiovascular:     Rate and Rhythm: Normal rate and regular rhythm.      Pulses: Normal pulses.     Heart sounds: Heart sounds not distant. No murmur heard.    No friction rub. No gallop.     Comments: No peripheral edema Pulmonary:     Effort: Pulmonary effort is normal. No respiratory distress.     Breath sounds: Normal breath sounds.  Skin:    General: Skin is warm and dry.     Findings: No rash.  Psychiatric:        Speech: Speech normal.        Behavior: Behavior normal.        Thought Content: Thought content normal.       Results for orders placed or performed in visit on 04/21/22  Thyroid Peroxidase Antibodies (TPO) ([facility])-[facility]   Collection Time: 04/21/22  3:51 PM  Result Value Ref Range   Thyroperoxidase Ab SerPl-aCnc 6 <9 IU/mL  Thyroid Stimulating Immunoglobulin   Collection Time: 04/21/22  3:51 PM  Result Value Ref Range   TSI <89 <140 % baseline  T4, Free   Collection Time: 04/21/22  3:51 PM  Result Value Ref Range   Free T4 0.97 0.60 - 1.60 ng/dL  T3, Free   Collection Time: 04/21/22  3:51 PM  Result Value Ref Range   T3, Free 3.3 2.3 - 4.2 pg/mL    Assessment and Plan  Oral lesion Assessment & Plan: Acute, no clear sign of bacterial infection, no suggestion of strep throat (no sore throat or exudate), symptoms not consistent with Candida. Inflamed area likely due to recent viral infection. Patient is slightly higher risk for oral cancer given dip use.  Recommend following over time, if not improving over the next couple of weeks, consider referral to ENT for evaluation.  No red flags for anaphylaxis or respiratory compromise.  Return and ER precautions provided.     No follow-ups on file.   Kerby Nora, MD

## 2023-04-19 DIAGNOSIS — M9903 Segmental and somatic dysfunction of lumbar region: Secondary | ICD-10-CM | POA: Diagnosis not present

## 2023-04-19 DIAGNOSIS — M62830 Muscle spasm of back: Secondary | ICD-10-CM | POA: Diagnosis not present

## 2023-04-19 DIAGNOSIS — M9902 Segmental and somatic dysfunction of thoracic region: Secondary | ICD-10-CM | POA: Diagnosis not present

## 2023-04-19 DIAGNOSIS — M5416 Radiculopathy, lumbar region: Secondary | ICD-10-CM | POA: Diagnosis not present

## 2023-09-06 ENCOUNTER — Encounter: Payer: Self-pay | Admitting: Dermatology

## 2023-09-06 ENCOUNTER — Ambulatory Visit (INDEPENDENT_AMBULATORY_CARE_PROVIDER_SITE_OTHER): Payer: BC Managed Care – PPO | Admitting: Dermatology

## 2023-09-06 DIAGNOSIS — Z86018 Personal history of other benign neoplasm: Secondary | ICD-10-CM

## 2023-09-06 DIAGNOSIS — D2372 Other benign neoplasm of skin of left lower limb, including hip: Secondary | ICD-10-CM

## 2023-09-06 DIAGNOSIS — Z1283 Encounter for screening for malignant neoplasm of skin: Secondary | ICD-10-CM

## 2023-09-06 DIAGNOSIS — Z8589 Personal history of malignant neoplasm of other organs and systems: Secondary | ICD-10-CM

## 2023-09-06 DIAGNOSIS — L814 Other melanin hyperpigmentation: Secondary | ICD-10-CM

## 2023-09-06 DIAGNOSIS — W908XXA Exposure to other nonionizing radiation, initial encounter: Secondary | ICD-10-CM

## 2023-09-06 DIAGNOSIS — L219 Seborrheic dermatitis, unspecified: Secondary | ICD-10-CM

## 2023-09-06 DIAGNOSIS — D239 Other benign neoplasm of skin, unspecified: Secondary | ICD-10-CM

## 2023-09-06 DIAGNOSIS — D229 Melanocytic nevi, unspecified: Secondary | ICD-10-CM

## 2023-09-06 DIAGNOSIS — Z79899 Other long term (current) drug therapy: Secondary | ICD-10-CM

## 2023-09-06 DIAGNOSIS — Z7189 Other specified counseling: Secondary | ICD-10-CM

## 2023-09-06 DIAGNOSIS — L578 Other skin changes due to chronic exposure to nonionizing radiation: Secondary | ICD-10-CM | POA: Diagnosis not present

## 2023-09-06 NOTE — Patient Instructions (Signed)

## 2023-09-06 NOTE — Progress Notes (Signed)
 Follow-Up Visit   Subjective  Benjamin Simon is a 63 y.o. male who presents for the following: Skin Cancer Screening and Full Body Skin Exam  The patient presents for Total-Body Skin Exam (TBSE) for skin cancer screening and mole check. The patient has spots, moles and lesions to be evaluated, some may be new or changing and the patient may have concern these could be cancer.  The following portions of the chart were reviewed this encounter and updated as appropriate: medications, allergies, medical history  Review of Systems:  No other skin or systemic complaints except as noted in HPI or Assessment and Plan.  Objective  Well appearing patient in no apparent distress; mood and affect are within normal limits.  A full examination was performed including scalp, head, eyes, ears, nose, lips, neck, chest, axillae, abdomen, back, buttocks, bilateral upper extremities, bilateral lower extremities, hands, feet, fingers, toes, fingernails, and toenails. All findings within normal limits unless otherwise noted below.   Relevant physical exam findings are noted in the Assessment and Plan.    Assessment & Plan   SKIN CANCER SCREENING PERFORMED TODAY.  ACTINIC DAMAGE - Chronic condition, secondary to cumulative UV/sun exposure - diffuse scaly erythematous macules with underlying dyspigmentation - Recommend daily broad spectrum sunscreen SPF 30+ to sun-exposed areas, reapply every 2 hours as needed.  - Staying in the shade or wearing long sleeves, sun glasses (UVA+UVB protection) and wide brim hats (4-inch brim around the entire circumference of the hat) are also recommended for sun protection.  - Call for new or changing lesions.  LENTIGINES, SEBORRHEIC KERATOSES, HEMANGIOMAS - Benign normal skin lesions - Benign-appearing - Call for any changes  MELANOCYTIC NEVI - Tan-brown and/or pink-flesh-colored symmetric macules and papules - Benign appearing on exam today - Observation - Call  clinic for new or changing moles - Recommend daily use of broad spectrum spf 30+ sunscreen to sun-exposed areas.   SEBORRHEIC DERMATITIS face Exam: face clear today Chronic condition with duration or expected duration over one year. Currently well-controlled.  Seborrheic Dermatitis is a chronic persistent rash characterized by pinkness and scaling most commonly of the mid face but also can occur on the scalp (dandruff), ears; mid chest, mid back and groin.  It tends to be exacerbated by stress and cooler weather.  People who have neurologic disease may experience new onset or exacerbation of existing seborrheic dermatitis.  The condition is not curable but treatable and can be controlled. Treatment Plan: Cont Xolegel  cr 3d/wk, Monday, Wednesday, Friday Cont HC 2.5% lotion 3d/wk, Tuesday, Thursday, Saturday Long term medication management.  Patient is using long term (months to years) prescription medication  to control their dermatologic condition.  These medications require periodic monitoring to evaluate for efficacy and side effects and may require periodic laboratory monitoring.    Topical steroids (such as triamcinolone, fluocinolone, fluocinonide, mometasone, clobetasol, halobetasol, betamethasone, hydrocortisone ) can cause thinning and lightening of the skin if they are used for too long in the same area. Your physician has selected the right strength medicine for your problem and area affected on the body. Please use your medication only as directed by your physician to prevent side effects.     HISTORY OF SQUAMOUS CELL CARCINOMA IN SITU OF THE SKIN - No evidence of recurrence today - Recommend regular full body skin exams - Recommend daily broad spectrum sunscreen SPF 30+ to sun-exposed areas, reapply every 2 hours as needed.  - Call if any new or changing lesions are noted between  office visits  - L anterior deltoid 08/20/2015   HISTORY OF DYSPLASTIC NEVUS No evidence of recurrence  today Recommend regular full body skin exams Recommend daily broad spectrum sunscreen SPF 30+ to sun-exposed areas, reapply every 2 hours as needed.  Call if any new or changing lesions are noted between office visits  -R medial inf scapula, 10/05/2016  DERMATOFIBROMA vs SK L foot. Dermatofibroma L knee Exam: Firm pink/brown papulenodule with dimple sign L dorsum foot, L med knee Treatment Plan: A dermatofibroma is a benign growth possibly related to trauma, such as an insect bite, cut from shaving, or inflamed acne-type bump.  Treatment options to remove include shave or excision with resulting scar and risk of recurrence.  Since benign-appearing and not bothersome, will observe for now.   Return in about 1 year (around 09/05/2024) for TBSE - Hx SCCIS, dysplastic nevus.  LILLETTE Rosina Mayans, CMA, am acting as scribe for Alm Rhyme, MD .   Documentation: I have reviewed the above documentation for accuracy and completeness, and I agree with the above.  Alm Rhyme, MD

## 2024-02-29 IMAGING — DX DG HIP (WITH OR WITHOUT PELVIS) 2-3V*L*
3 series · 3 of 3 positions shown · non-contrast
Comparison: None.

CLINICAL DATA: When skiing and having back and hip pain. Posterior
left hip pain.

EXAM:
DG HIP (WITH OR WITHOUT PELVIS) 2-3V LEFT

[pelvis ap]
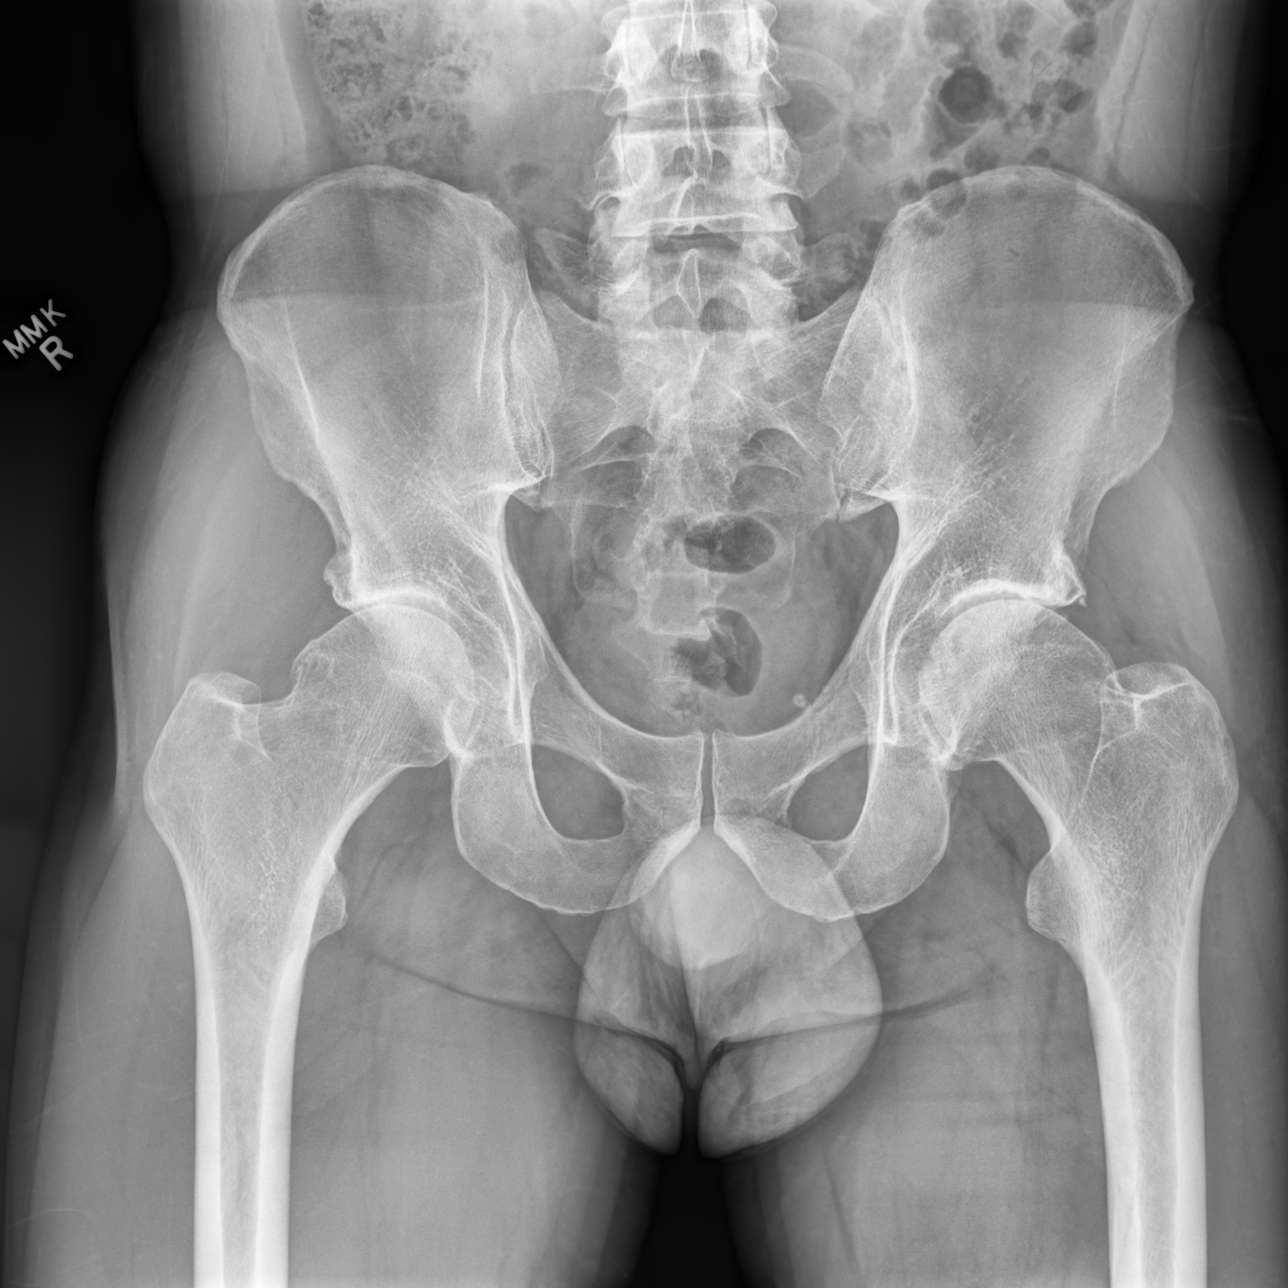

[hip ap]
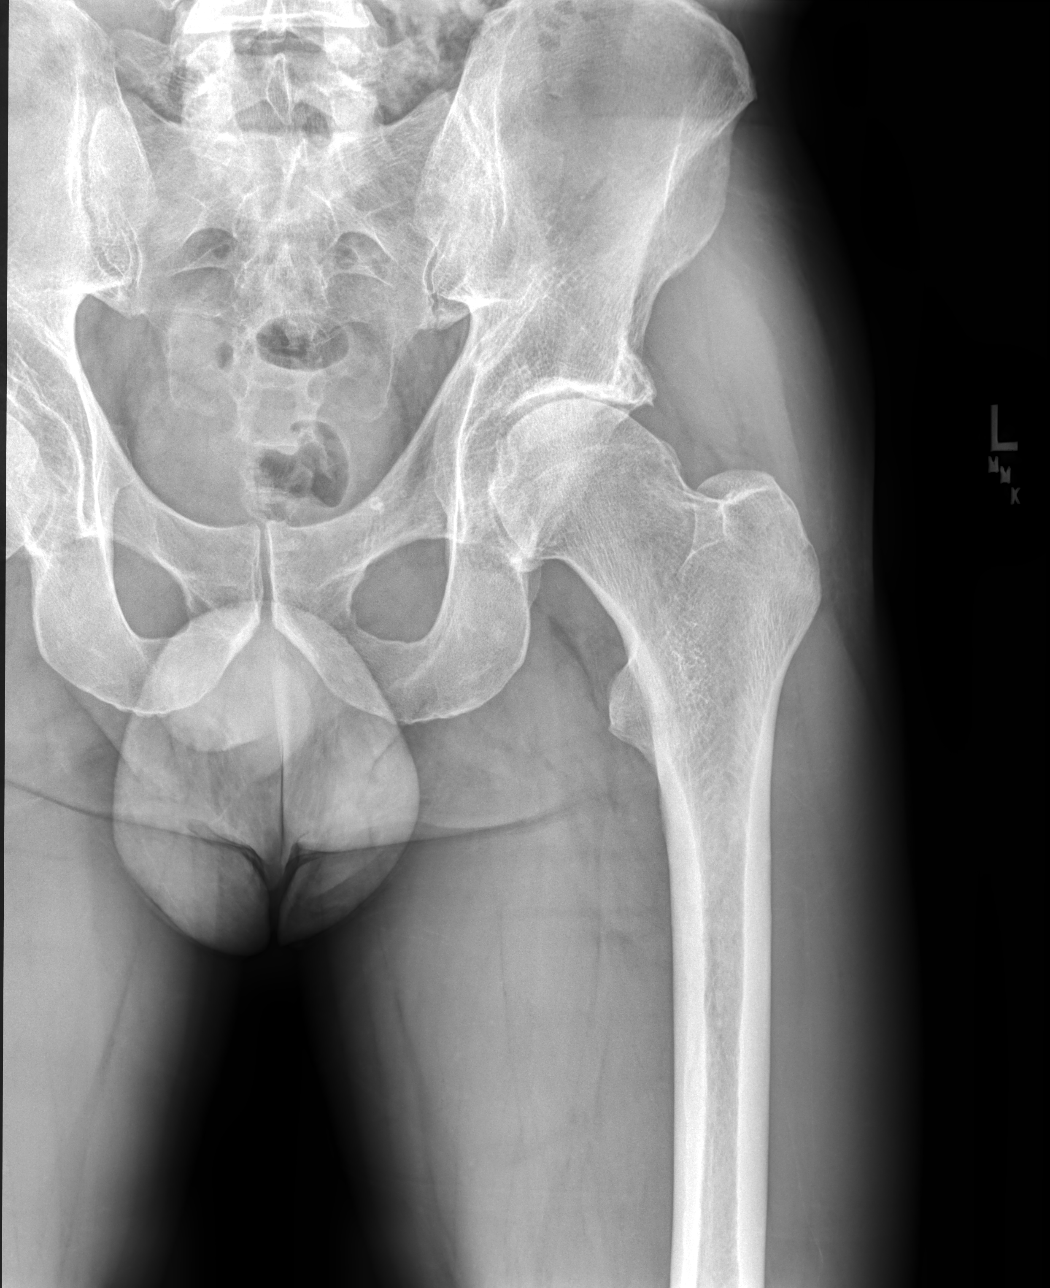

[hip (frog leg)]
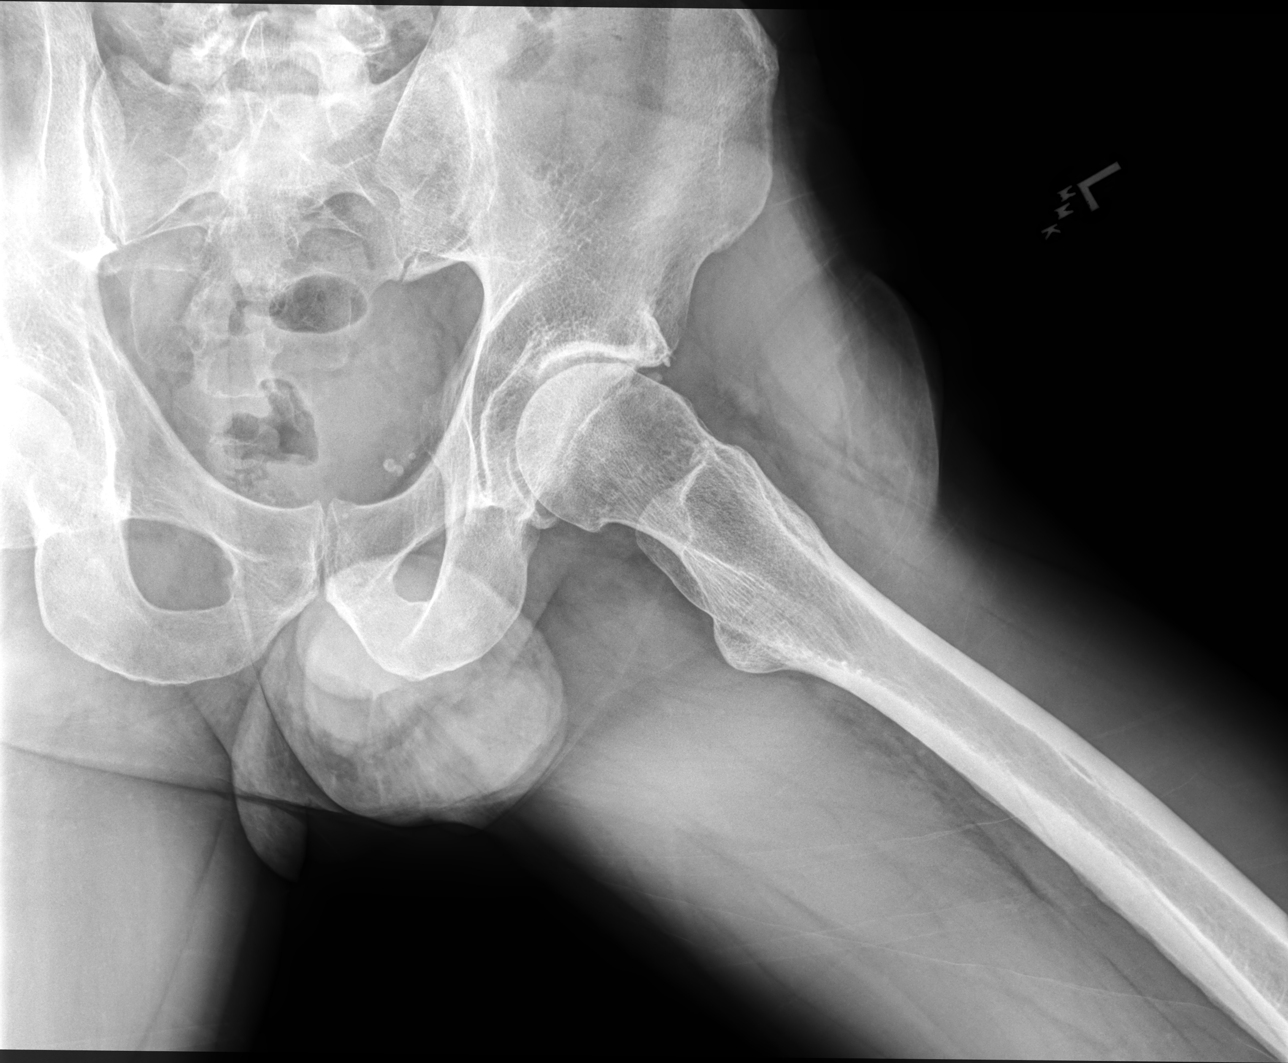

[3 of 3 positions shown; findings below may reference images not displayed]

FINDINGS: Severe bilateral femoroacetabular joint space narrowing. On lateral
view of the left hip this is seen to be greatest at the anterior
superior left femoroacetabular junction. Moderate
left-greater-than-right peripheral acetabular degenerative
osteophytes. Moderate bilateral anterior superior femoral head-neck
junction degenerative osteophytes/convexities/CAM-type bump
deformities.

No acute fracture or dislocation.
IMPRESSION: Moderate bilateral femoroacetabular osteoarthritis.

Bilateral femoral head-neck junction CAM-type bump deformities which
increase predisposition to CAM-type femoroacetabular impingement.

## 2024-09-10 ENCOUNTER — Ambulatory Visit: Admitting: Dermatology
